# Patient Record
Sex: Female | Born: 1945 | Race: White | Hispanic: No | Marital: Single | Smoking: Former smoker
Health system: Southern US, Community
[De-identification: ages and names within clinical notes are randomized; demographics above are authoritative.]

## PROBLEM LIST (undated history)

## (undated) DIAGNOSIS — T8859XA Other complications of anesthesia, initial encounter: Secondary | ICD-10-CM

## (undated) DIAGNOSIS — F329 Major depressive disorder, single episode, unspecified: Secondary | ICD-10-CM

## (undated) DIAGNOSIS — R51 Headache: Secondary | ICD-10-CM

## (undated) DIAGNOSIS — N200 Calculus of kidney: Secondary | ICD-10-CM

## (undated) DIAGNOSIS — M1990 Unspecified osteoarthritis, unspecified site: Secondary | ICD-10-CM

## (undated) DIAGNOSIS — T7840XA Allergy, unspecified, initial encounter: Secondary | ICD-10-CM

## (undated) DIAGNOSIS — K589 Irritable bowel syndrome without diarrhea: Secondary | ICD-10-CM

## (undated) DIAGNOSIS — J189 Pneumonia, unspecified organism: Secondary | ICD-10-CM

## (undated) DIAGNOSIS — Z87442 Personal history of urinary calculi: Secondary | ICD-10-CM

## (undated) DIAGNOSIS — C801 Malignant (primary) neoplasm, unspecified: Secondary | ICD-10-CM

## (undated) DIAGNOSIS — J449 Chronic obstructive pulmonary disease, unspecified: Secondary | ICD-10-CM

## (undated) DIAGNOSIS — F32A Depression, unspecified: Secondary | ICD-10-CM

## (undated) DIAGNOSIS — N390 Urinary tract infection, site not specified: Secondary | ICD-10-CM

## (undated) HISTORY — PX: ABDOMINAL HYSTERECTOMY: SHX81

## (undated) HISTORY — DX: Allergy, unspecified, initial encounter: T78.40XA

## (undated) HISTORY — DX: Major depressive disorder, single episode, unspecified: F32.9

## (undated) HISTORY — DX: Irritable bowel syndrome, unspecified: K58.9

## (undated) HISTORY — DX: Malignant (primary) neoplasm, unspecified: C80.1

## (undated) HISTORY — DX: Calculus of kidney: N20.0

## (undated) HISTORY — DX: Headache: R51

## (undated) HISTORY — PX: COLON SURGERY: SHX602

## (undated) HISTORY — PX: BREAST SURGERY: SHX581

## (undated) HISTORY — DX: Depression, unspecified: F32.A

## (undated) HISTORY — DX: Urinary tract infection, site not specified: N39.0

## (undated) HISTORY — DX: Unspecified osteoarthritis, unspecified site: M19.90

---

## 1964-01-23 HISTORY — PX: OTHER SURGICAL HISTORY: SHX169

## 1970-01-22 HISTORY — PX: ABDOMINAL HYSTERECTOMY: SHX81

## 1984-01-23 HISTORY — PX: OVARIAN CYST SURGERY: SHX726

## 2008-01-23 DIAGNOSIS — Z8781 Personal history of (healed) traumatic fracture: Secondary | ICD-10-CM

## 2008-01-23 HISTORY — DX: Personal history of (healed) traumatic fracture: Z87.81

## 2009-08-24 ENCOUNTER — Encounter: Payer: Self-pay | Admitting: Family Medicine

## 2009-09-14 ENCOUNTER — Ambulatory Visit: Payer: Self-pay | Admitting: Family Medicine

## 2009-09-14 DIAGNOSIS — J45909 Unspecified asthma, uncomplicated: Secondary | ICD-10-CM | POA: Insufficient documentation

## 2009-09-14 DIAGNOSIS — Z85828 Personal history of other malignant neoplasm of skin: Secondary | ICD-10-CM | POA: Insufficient documentation

## 2009-09-14 DIAGNOSIS — F3289 Other specified depressive episodes: Secondary | ICD-10-CM | POA: Insufficient documentation

## 2009-09-14 DIAGNOSIS — R519 Headache, unspecified: Secondary | ICD-10-CM | POA: Insufficient documentation

## 2009-09-14 DIAGNOSIS — M1990 Unspecified osteoarthritis, unspecified site: Secondary | ICD-10-CM | POA: Insufficient documentation

## 2009-09-14 DIAGNOSIS — Z87442 Personal history of urinary calculi: Secondary | ICD-10-CM | POA: Insufficient documentation

## 2009-09-14 DIAGNOSIS — J309 Allergic rhinitis, unspecified: Secondary | ICD-10-CM | POA: Insufficient documentation

## 2009-09-14 DIAGNOSIS — M169 Osteoarthritis of hip, unspecified: Secondary | ICD-10-CM | POA: Insufficient documentation

## 2009-09-14 DIAGNOSIS — E8941 Symptomatic postprocedural ovarian failure: Secondary | ICD-10-CM | POA: Insufficient documentation

## 2009-09-14 DIAGNOSIS — R51 Headache: Secondary | ICD-10-CM | POA: Insufficient documentation

## 2009-09-14 DIAGNOSIS — F329 Major depressive disorder, single episode, unspecified: Secondary | ICD-10-CM

## 2009-09-20 ENCOUNTER — Encounter: Admission: RE | Admit: 2009-09-20 | Discharge: 2009-09-20 | Payer: Self-pay | Admitting: Family Medicine

## 2009-09-20 ENCOUNTER — Encounter: Payer: Self-pay | Admitting: Family Medicine

## 2009-09-27 ENCOUNTER — Encounter: Payer: Self-pay | Admitting: Family Medicine

## 2009-10-21 ENCOUNTER — Ambulatory Visit: Payer: Self-pay | Admitting: Family Medicine

## 2009-10-21 DIAGNOSIS — M79609 Pain in unspecified limb: Secondary | ICD-10-CM | POA: Insufficient documentation

## 2009-10-24 ENCOUNTER — Ambulatory Visit: Payer: Self-pay | Admitting: Family Medicine

## 2009-10-25 ENCOUNTER — Ambulatory Visit: Payer: Self-pay | Admitting: Diagnostic Radiology

## 2009-10-25 ENCOUNTER — Ambulatory Visit (HOSPITAL_BASED_OUTPATIENT_CLINIC_OR_DEPARTMENT_OTHER): Admission: RE | Admit: 2009-10-25 | Discharge: 2009-10-25 | Payer: Self-pay | Admitting: Family Medicine

## 2009-10-25 LAB — CONVERTED CEMR LAB
Hep B S Ab: NEGATIVE
Mumps IgG: 5.99 — ABNORMAL HIGH
Rubella: 264.9 intl units/mL — ABNORMAL HIGH
Rubeola IgG: 4.26 — ABNORMAL HIGH

## 2009-10-26 ENCOUNTER — Ambulatory Visit: Payer: Self-pay | Admitting: Family Medicine

## 2010-02-21 ENCOUNTER — Encounter: Payer: Self-pay | Admitting: Internal Medicine

## 2010-02-21 ENCOUNTER — Ambulatory Visit
Admission: RE | Admit: 2010-02-21 | Discharge: 2010-02-21 | Payer: Self-pay | Source: Home / Self Care | Attending: Internal Medicine | Admitting: Internal Medicine

## 2010-02-21 DIAGNOSIS — R319 Hematuria, unspecified: Secondary | ICD-10-CM | POA: Insufficient documentation

## 2010-02-21 DIAGNOSIS — N3090 Cystitis, unspecified without hematuria: Secondary | ICD-10-CM | POA: Insufficient documentation

## 2010-02-21 LAB — CONVERTED CEMR LAB
Glucose, Urine, Semiquant: NEGATIVE
Nitrite: NEGATIVE
Specific Gravity, Urine: 1.015
WBC Urine, dipstick: NEGATIVE
pH: 6

## 2010-02-21 NOTE — Assessment & Plan Note (Signed)
Summary: foot problem/shingle & flu shot//kn   Vital Signs:  Patient profile:   65 year old female Menstrual status:  hysterectomy Weight:      127.0 pounds Temp:     98.1 degrees F oral BP sitting:   110 / 72  (left arm) Cuff size:   regular  Vitals Entered By: Almeta Monas CMA Duncan Dull) (October 21, 2009 11:02 AM) CC: c/o knot on the left foot. Also wants flu and shingles vaccine   History of Present Illness: Pt here to have shingles vaccine and flu but is also c/o L foot pain and a knot laterally for 3-4 weeks.  No known injury but is painful.  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     Preventive Screening-Counseling & Management  Alcohol-Tobacco     Alcohol drinks/day: <1     Smoking Status: current     Smoking Cessation Counseling: yes     Smoke Cessation Stage: precontemplative     Packs/Day: 0.5     Year Started: 1960  Caffeine-Diet-Exercise     Caffeine use/day: 1     Does Patient Exercise: yes     Type of exercise: yoga, walking     Exercise (avg: min/session): <30     Times/week: 7     Exercise Counseling: to improve exercise regimen  Current Medications (verified): 1)  Aldara 5 % Crea (Imiquimod) 2)  Zostavax 16109 Unt/0.60ml Solr (Zoster Vaccine Live) .Marland Kitchen.. 1 Ml Im X1 3)  Calcium-Vitamin D 600-200 Mg-Unit Tabs (Calcium-Vitamin D) .Marland Kitchen.. 1 By Mouth Two Times A Day  Allergies (verified): No Known Drug Allergies  Past History:  Past Medical History: Last updated: 09/14/2009  UTI IBS Asthma Skin cancer, hx of Allergic rhinitis Depression Headache Nephrolithiasis, hx of Osteoarthritis  Past Surgical History: Last updated: 09/14/2009 hysterectomy 1971 fibroids-  [place]--ovaries taken 1990 secondary to cysts breast biopsy 1988---cyst aspirated  Family History: Last updated: 09/14/2009 Family History Hypertension Family History of Asthma Family History of Arthritis M-mac degen Family History of Skin cancer  Social History: Last  updated: 09/14/2009 1 dog, 2 cats and a bearded dragon Single Former Smoker Current Smoker Alcohol use-no Drug use-no Regular exercise-yes  Risk Factors: Alcohol Use: <1 (10/21/2009) Caffeine Use: 1 (10/21/2009) Exercise: yes (10/21/2009)  Risk Factors: Smoking Status: current (10/21/2009) Packs/Day: 0.5 (10/21/2009)  Family History: Reviewed history from 09/14/2009 and no changes required. Family History Hypertension Family History of Asthma Family History of Arthritis M-mac degen Family History of Skin cancer  Social History: Reviewed history from 09/14/2009 and no changes required. 1 dog, 2 cats and a bearded dragon Single Former Smoker Current Smoker Alcohol use-no Drug use-no Regular exercise-yes  Review of Systems      See HPI  Physical Exam  General:  Well-developed,well-nourished,in no acute distress; alert,appropriate and cooperative throughout examination Neck:  No deformities, masses, or tenderness noted. Lungs:  Normal respiratory effort, chest expands symmetrically. Lungs are clear to auscultation, no crackles or wheezes. Heart:  normal rate and no murmur.   Extremities:  L foot ----+ hard nodule lateral 5th metatarsal  Psych:  Cognition and judgment appear intact. Alert and cooperative with normal attention span and concentration. No apparent delusions, illusions, hallucinations  Impression & Recommendations:  Problem # 1:  FOOT PAIN, LEFT (ICD-729.5)  Orders: T-Foot Left Min 3 Views (73630TC)  Problem # 2:  PREVENTIVE HEALTH CARE (ICD-V70.0) form filled out rto mon for ppd and bring immunizations Orders: Venipuncture (04540)  Complete Medication List: 1)  Aldara 5 % Crea (Imiquimod) 2)  Zostavax 98119 Unt/0.66ml Solr (Zoster vaccine live) .Marland Kitchen.. 1 ml im x1 3)  Calcium-vitamin D 600-200 Mg-unit Tabs (Calcium-vitamin d) .Marland Kitchen.. 1 by mouth two  times a day  Other Orders: Flu Vaccine 2yrs + MEDICARE PATIENTS (J4782) Administration Flu vaccine - MCR (G0008) Zoster (Shingles) Vaccine Live (95621) Admin 1st Vaccine (30865)  Patient Instructions: 1)  return Monday for PPD Flu Vaccine Consent Questions     Do you have a history of severe allergic reactions to this vaccine? no    Any prior history of allergic reactions to egg and/or gelatin? no    Do you have a sensitivity to the preservative Thimersol? no    Do you have a past history of Guillan-Barre Syndrome? no    Do you currently have an acute febrile illness? no    Have you ever had a severe reaction to latex? no    Vaccine information given and explained to patient? yes    Are you currently pregnant? no    Lot Number:[id number]   Exp Date:07/22/2010   Site Given  Left Deltoid IM Monday for PPD   Immunizations Administered:  Zostavax # 1:    Vaccine Type: Zostavax    Site: right deltoid    Mfr: Merck    Dose: 0.5 ml    Route: [place]    Given by: Almeta Monas CMA (AAMA)    Exp. Date: 09/09/2010    Lot #: [id number]    VIS given: 11/03/04 given October 21, 2009. Marland Kitchenlbmedflu   Immunizations Administered:  Zostavax # 1:    Vaccine Type: Zostavax    Site: right deltoid    Mfr: Merck    Dose: 0.5 ml    Route: [place]    Given by: Almeta Monas CMA (AAMA)    Exp. Date: 09/09/2010    Lot #: [id number]    VIS given: 11/03/04 given October 21, 2009.

## 2010-02-21 NOTE — Miscellaneous (Signed)
Summary: Vaccine Records  Vaccine Records   Imported By: Lanelle Bal 10/31/2009 13:51:11  _____________________________________________________________________  External Attachment:    Type:   Image     Comment:   External Document

## 2010-02-21 NOTE — Assessment & Plan Note (Signed)
Summary: NEW TO ESTAB/CPX/CBS   Vital Signs:  Patient profile:   65 year old female Menstrual status:  hysterectomy Height:      61.75 inches Weight:      130 pounds BMI:     24.06 Temp:     98.5 degrees F oral Pulse rate:   76 / minute Pulse rhythm:   regular BP sitting:   102 / 68  (right arm)  Vitals Entered By: Almeta Monas CMA Duncan Dull) (September 14, 2009 10:22 AM) CC: New Est care, pt fasting pap is current     Menstrual Status hysterectomy   History of Present Illness: Pt here to establish.  She had been seen by gyn for vaginitis --see ov note and labs were done.  D/C now cleared up.    She had mentioned to him that her hair has been coming out.   She feels that she has lost 60%of her hair.     Preventive Screening-Counseling & Management  Alcohol-Tobacco     Alcohol drinks/day: <1     Smoking Status: current     Smoking Cessation Counseling: yes     Smoke Cessation Stage: precontemplative     Packs/Day: 0.5     Year Started: 1960  Caffeine-Diet-Exercise     Caffeine use/day: 1     Does Patient Exercise: yes     Type of exercise: yoga, walking     Exercise (avg: min/session): <30     Times/week: 7     Exercise Counseling: to improve exercise regimen  Hep-HIV-STD-Contraception     Dental Visit-last 6 months no     Dental Care Counseling: no ins     SBE monthly: no     SBE Education/Counseling: to perform regular SBE  Safety-Violence-Falls     Smoke Detectors: yes     Violence in the Home: no risk noted     Sexual Abuse: no     Fall Risk: no      Sexual History:  single.        Drug Use:  no.    Current Medications (verified): 1)  Aldara 5 % Crea (Imiquimod) 2)  Zostavax 16109 Unt/0.74ml Solr (Zoster Vaccine Live) .Marland Kitchen.. 1 Ml Im X1 3)  Calcium-Vitamin D 600-200 Mg-Unit Tabs (Calcium-Vitamin D) .Marland Kitchen.. 1 By Mouth Two Times A Day  Allergies (verified): No Known Drug Allergies  Past History:  Family History: Last updated: 09/14/2009 Family History  Hypertension Family History of Asthma Family History of Arthritis M-mac degen Family History of Skin cancer  Social History: Last updated: 09/14/2009 1 dog, 2 cats and a bearded dragon Single Former Smoker Current Smoker Alcohol use-no Drug use-no Regular exercise-yes  Risk Factors: Alcohol Use: <1 (09/14/2009) Caffeine Use: 1 (09/14/2009) Exercise: yes (09/14/2009)  Risk Factors: Smoking Status: current (09/14/2009) Packs/Day: 0.5 (09/14/2009)  Past Medical History:  UTI IBS Asthma Skin cancer, hx of Allergic rhinitis Depression Headache Nephrolithiasis, hx of Osteoarthritis  Past Surgical History: [place]rectomy 1971 fibroids-  [place]--ovaries taken 1990 secondary to cysts breast biopsy 1988---cyst aspirated  Family History: Reviewed history and no changes required. Family History Hypertension Family History of Asthma Family History of Arthritis M-mac degen Family History of Skin cancer  Social History: Reviewed history and no changes required. 1 dog, 2 cats and a bearded dragon Single Former Smoker Current Smoker Alcohol use-no Drug use-no Regular exercise-yes Smoking Status:  current Packs/Day:  0.5 Caffeine use/day:  1 Does Patient Exercise:  yes Dental Care w/in 6 mos.:  no Sexual History:  single Fall Risk:  no Drug Use:  no  Review of Systems      See HPI General:  Denies chills, fatigue, fever, loss of appetite, malaise, sleep disorder, sweats, weakness, and weight loss. Eyes:  Denies blurring, discharge, double vision, eye irritation, eye pain, halos, itching, light sensitivity, red eye, vision loss-1 eye, and vision loss-both eyes; c/o eyelid drooping-- due for optho. ENT:  Denies decreased hearing, difficulty swallowing, ear discharge, earache, hoarseness, nasal congestion, nosebleeds, postnasal drainage, ringing in ears, sinus pressure, and sore throat. CV:  Denies bluish discoloration of lips or nails, chest pain or discomfort,  difficulty breathing at night, difficulty breathing while lying down, fainting, fatigue, leg cramps with exertion, lightheadness, near fainting, palpitations, shortness of breath with exertion, swelling of feet, swelling of hands, and weight gain. Resp:  Denies chest discomfort, chest pain with inspiration, cough, coughing up blood, excessive snoring, hypersomnolence, morning headaches, pleuritic, shortness of breath, sputum productive, and wheezing. GI:  Denies abdominal pain, bloody stools, change in bowel habits, constipation, dark tarry stools, diarrhea, excessive appetite, gas, hemorrhoids, indigestion, loss of appetite, nausea, vomiting, vomiting blood, and yellowish skin color. GU:  Denies abnormal vaginal bleeding, decreased libido, discharge, dysuria, genital sores, hematuria, incontinence, nocturia, urinary frequency, and urinary hesitancy. MS:  Denies joint pain, joint redness, joint swelling, loss of strength, low back pain, mid back pain, muscle aches, muscle , cramps, muscle weakness, stiffness, and thoracic pain. Derm:  Denies changes in color of skin, changes in nail beds, dryness, excessive perspiration, flushing, hair loss, insect bite(s), itching, lesion(s), poor wound healing, and rash. Neuro:  Denies brief paralysis, difficulty with concentration, disturbances in coordination, falling down, headaches, inability to speak, memory loss, numbness, poor balance, seizures, sensation of room spinning, tingling, tremors, visual disturbances, and weakness. Psych:  Denies alternate hallucination ( auditory/visual), anxiety, depression, easily angered, easily tearful, irritability, mental problems, panic attacks, sense of great danger, suicidal thoughts/plans, thoughts of violence, unusual visions or sounds, and thoughts /plans of harming others. Endo:  Denies cold intolerance, excessive hunger, excessive thirst, excessive urination, heat intolerance, polyuria, and weight change. Heme:  Denies  abnormal bruising, bleeding, enlarge lymph nodes, fevers, pallor, and skin discoloration. Allergy:  Denies hives or rash, itching eyes, persistent infections, seasonal allergies, and sneezing.  Physical Exam  General:  Well-developed,well-nourished,in no acute distress; alert,appropriate and cooperative throughout examination Head:  Normocephalic and atraumatic without obvious abnormalities. No apparent alopecia or balding. Eyes:  vision grossly intact, pupils equal, pupils round, pupils reactive to light, and no injection.   Ears:  External ear exam shows no significant lesions or deformities.  Otoscopic examination reveals clear canals, tympanic membranes are intact bilaterally without bulging, retraction, inflammation or discharge. Hearing is grossly normal bilaterally. Nose:  External nasal examination shows no deformity or inflammation. Nasal mucosa are pink and moist without lesions or exudates. Mouth:  Oral mucosa and oropharynx without lesions or exudates.  Teeth in good repair. Neck:  No deformities, masses, or tenderness noted. Chest Wall:  No deformities, masses, or tenderness noted. Breasts:  No mass, nodules, thickening, tenderness, bulging, retraction, inflamation, nipple discharge or skin changes noted.   Lungs:  Normal respiratory effort, chest expands symmetrically. Lungs are clear to auscultation, no crackles or wheezes. Heart:  normal rate and no murmur.   Abdomen:  Bowel sounds positive,abdomen soft and non-tender without masses, organomegaly or hernias noted. Msk:  normal ROM, no joint tenderness, no joint swelling, no joint warmth, no redness over joints, no joint deformities, no joint instability,  and no crepitation.   Pulses:  R posterior tibial normal, R dorsalis pedis normal, R carotid normal, L posterior tibial normal, L dorsalis pedis normal, and L carotid normal.   Extremities:  No clubbing, cyanosis, edema, or deformity noted with normal full range of motion of all  joints.   Neurologic:  No cranial nerve deficits noted. Station and gait are normal. Plantar reflexes are down-going bilaterally. DTRs are symmetrical throughout. Sensory, motor and coordinative functions appear intact. Skin:  Intact without suspicious lesions or rashes Cervical Nodes:  No lymphadenopathy noted Axillary Nodes:  No palpable lymphadenopathy Psych:  Cognition and judgment appear intact. Alert and cooperative with normal attention span and concentration. No apparent delusions, illusions, hallucinations   Impression & Recommendations:  Problem # 1:  ARTIFICIAL MENOPAUSE (ICD-627.4)  Orders: Radiology Referral (Radiology)  Discussed treatment options.   Problem # 2:  OSTEOARTHRITIS (ICD-715.90)  Discussed use of medications, application of heat or cold, and exercises.   Problem # 3:  PREVENTIVE HEALTH CARE (ICD-V70.0) we will get labs from gyn ghm utd Orders: Radiology Referral (Radiology) EKG w/ Interpretation (93000)  Problem # 4:  DEPRESSION (ICD-311)  Problem # 5:  SKIN CANCER, HX OF (ICD-V10.83)  Orders: Dermatology Referral (Derma)  Problem # 6:  ASTHMA (ICD-493.90) Assessment: Improved  Complete Medication List: 1)  Aldara 5 % Crea (Imiquimod) 2)  Zostavax 04540 Unt/0.13ml Solr (Zoster vaccine live) .Marland Kitchen.. 1 ml im x1 3)  Calcium-vitamin D 600-200 Mg-unit Tabs (Calcium-vitamin d) .Marland Kitchen.. 1 by mouth two times a day  Patient Instructions: 1)  Take calcium +vitamin D daily.  Prescriptions: ZOSTAVAX 98119 UNT/0.65ML SOLR (ZOSTER VACCINE LIVE) 1 ml IM x1  #1 x 0   Entered and Authorized by:   Loreen Freud DO   Signed by:   Loreen Freud DO on 09/14/2009   Method used:   Print then Give to Patient   RxID:   [id number]    EKG  Procedure date:  09/14/2009  Findings:      Normal sinus rhythm with rate of:  69 bpm   Pneumovax Result Date:  10/08/2007 Pneumovax Result:  given Pneumovax Next Due:  5 yr Colonoscopy Result Date:   09/03/2007 Colonoscopy Result:  abnormal- IBS, c diff Colonoscopy Next Due:  10 yr PAP Next Due:  Not Indicated

## 2010-02-21 NOTE — Letter (Signed)
Summary: Health Exam Form/[facility] [employer]  Health Exam Form/[facility]   Imported By: Lanelle Bal 11/02/2009 11:49:49  _____________________________________________________________________  External Attachment:    Type:   Image     Comment:   External Document

## 2010-02-21 NOTE — Letter (Signed)
Summary: Arther Abbott MD OB GYN  Arther Abbott MD OB GYN   Imported By: Lanelle Bal 09/06/2009 09:51:38  _____________________________________________________________________  External Attachment:    Type:   Image     Comment:   External Document

## 2010-02-21 NOTE — Assessment & Plan Note (Signed)
Summary: ppd/cbs  Nurse Visit   Allergies: No Known Drug Allergies  Immunizations Administered:  PPD Skin Test:    Vaccine Type: PPD    Site: left forearm    Mfr: Sanofi Pasteur    Dose: 0.1 ml    Route: ID    Given by: Almeta Monas CMA (AAMA)    Exp. Date: 11/24/2010    Lot #: [id number]  Orders Added: 1)  Specimen Handling [99000] 2)  Venipuncture [16109] 3)  T- * Misc. Laboratory test [99999] 4)  TB Skin Test [86580] 5)  Admin 1st Vaccine [90471]  Appended Document: Orders Update    Clinical Lists Changes  Observations: Added new observation of TB PPDRESULT: negative (10/26/2009 13:32) Added new observation of PPD RESULT: < 5mm (10/26/2009 13:32) Added new observation of TB-PPD RDDTE: 10/26/2009 (10/26/2009 13:32)       PPD Results    Date of reading: 10/26/2009    Results: < 5mm    Interpretation: negative

## 2010-02-21 NOTE — Consult Note (Signed)
Summary: [facility]  [facility]   Imported By: Lanelle Bal 10/28/2009 11:46:43  _____________________________________________________________________  External Attachment:    Type:   Image     Comment:   External Document

## 2010-02-21 NOTE — Assessment & Plan Note (Signed)
Summary: SHOTS//PH  Nurse Visit   Allergies: No Known Drug Allergies  Immunizations Administered:  Hepatitis B Vaccine # 1:    Vaccine Type: HepB Adult    Site: left deltoid    Mfr: Merck    Dose: 0.1 ml    Route: IM    Given by: Almeta Monas CMA (AAMA)    Exp. Date: 04/21/2011    Lot #: [id number]    VIS given: 08/08/05 version given October 26, 2009.  Orders Added: 1)  Hepatitis B Vaccine >48yrs [90746] 2)  Admin 1st Vaccine [phone]

## 2010-02-22 ENCOUNTER — Encounter: Payer: Self-pay | Admitting: Internal Medicine

## 2010-02-24 LAB — CONVERTED CEMR LAB
Bacteria, UA: NONE SEEN
Casts: NONE SEEN /lpf

## 2010-02-28 ENCOUNTER — Ambulatory Visit (INDEPENDENT_AMBULATORY_CARE_PROVIDER_SITE_OTHER): Payer: Medicare PPO | Admitting: Family Medicine

## 2010-02-28 ENCOUNTER — Encounter: Payer: Self-pay | Admitting: Family Medicine

## 2010-02-28 DIAGNOSIS — N952 Postmenopausal atrophic vaginitis: Secondary | ICD-10-CM

## 2010-02-28 DIAGNOSIS — R319 Hematuria, unspecified: Secondary | ICD-10-CM

## 2010-02-28 LAB — CONVERTED CEMR LAB
Bilirubin Urine: NEGATIVE
Glucose, Urine, Semiquant: NEGATIVE
Protein, U semiquant: NEGATIVE
Specific Gravity, Urine: 1.01
WBC Urine, dipstick: NEGATIVE
pH: 5

## 2010-03-01 NOTE — Assessment & Plan Note (Signed)
Summary: possible UTI??//lch   Vital Signs:  Patient profile:   65 year old female Menstrual status:  hysterectomy Weight:      133.50 pounds Temp:     98.1 degrees F oral Pulse rate:   86 / minute Pulse rhythm:   regular BP sitting:   122 / 80  (left arm) Cuff size:   regular  Vitals Entered By: Army Fossa CMA (February 21, 2010 10:02 AM) CC: UTI?  Comments urinating blood x 3-4 days  unable to leave sample  [facility] [address]     History of Present Illness: 3 or 4 days history of dysuria Single episode of gross hematuria today No history of recurring UTIs however in the last 5 years she has developed a UTI usually yearly  Current Medications (verified): 1)  Calcium-Vitamin D 600-200 Mg-Unit Tabs (Calcium-Vitamin D) .Marland Kitchen.. 1 By Mouth Two Times A Day  Allergies (verified): No Known Drug Allergies  Past History:  Past Medical History: Reviewed history from 09/14/2009 and no changes required.  UTI IBS Asthma Skin cancer, hx of Allergic rhinitis Depression Headache Nephrolithiasis, hx of Osteoarthritis  Past Surgical History: Reviewed history from 09/14/2009 and no changes required. hysterectomy 1971 fibroids-  [place]--ovaries taken 1990 secondary to cysts breast biopsy 1988---cyst aspirated  Social History: Reviewed history from 09/14/2009 and no changes required. 1 dog, 2 cats and a bearded dragon Single Former Smoker Current Smoker Alcohol use-no Drug use-no Regular exercise-yes  Review of Systems General:  Denies fever; ?chills . GI:  Denies nausea and vomiting. GU:  (+) difficulty voiding  no back pain, no lower abd pain no vag rash or itching.  Physical Exam  General:  alert and well-developed.   Abdomen:  soft, non-tender, no distention, no masses, no guarding, and no rigidity.  no CVA tenderness Psych:  not anxious appearing and not depressed appearing.     Impression & Recommendations:  Problem # 1:  CYSTITIS (ICD-595.9) symptoms  consistent with cystitis, see prescriptions and instructions. She also admits to vaginal dryness, recommend to discuss with PCP possible Premarin cream to help prevent UTIs.  Her updated medication list for this problem includes:    Ciprofloxacin Hcl 500 Mg Tabs (Ciprofloxacin hcl) .Marland Kitchen... 1 by mouth two times a day x 3 days    Pyridium 100 Mg Tabs (Phenazopyridine hcl) .Marland Kitchen... 1 by mouth three times a day x 2 days  Complete Medication List: 1)  Calcium-vitamin D 600-200 Mg-unit Tabs (Calcium-vitamin d) .Marland Kitchen.. 1 by mouth two times a day 2)  Ciprofloxacin Hcl 500 Mg Tabs (Ciprofloxacin hcl) .Marland Kitchen.. 1 by mouth two times a day x 3 days 3)  Pyridium 100 Mg Tabs (Phenazopyridine hcl) .Marland Kitchen.. 1 by mouth three times a day x 2 days  Other Orders: UA Dipstick w/o Micro (automated)  (81003) T-Culture, Urine (62130-86578) T-Urine Microscopic ([id number])  Patient Instructions: 1)  lots of fluids 2)  call if no better in few days Prescriptions: PYRIDIUM 100 MG TABS (PHENAZOPYRIDINE HCL) 1 by mouth three times a day x 2 days  #6 x 0   Entered and Authorized by:   Nolon Rod. Mccabe Gloria MD   Signed by:   Nolon Rod. Husain Costabile MD on 02/21/2010   Method used:   Print then Give to Patient   RxID:   [id number] CIPROFLOXACIN HCL 500 MG TABS (CIPROFLOXACIN HCL) 1 by mouth two times a day x 3 days  #6 x 0   Entered and Authorized by:   Nolon Rod. Willona Phariss MD  Signed by:   Nolon Rod Abdur Hoglund MD on 02/21/2010   Method used:   Print then Give to Patient   RxID:   [id number]    Orders Added: 1)  UA Dipstick w/o Micro (automated)  [81003] 2)  T-Culture, Urine [[id number]] 3)  T-Urine Microscopic [[id number]] 4)  Est. Patient Level III [62952]    Laboratory Results   Urine Tests    Routine Urinalysis   Color: yellow Appearance: Clear Glucose: negative   (Normal Range: Negative) Bilirubin: negative   (Normal Range: Negative) Ketone: negative   (Normal Range: Negative) Spec. Gravity: 1.015   (Normal Range:  1.003-1.035) Blood: large   (Normal Range: Negative) pH: 6.0   (Normal Range: 5.0-8.0) Protein: negative   (Normal Range: Negative) Urobilinogen: 0.2   (Normal Range: 0-1) Nitrite: negative   (Normal Range: Negative) Leukocyte Esterace: negative   (Normal Range: Negative)    Comments: Army Fossa CMA  February 21, 2010 10:12 AM

## 2010-03-09 NOTE — Assessment & Plan Note (Signed)
Summary: RECURRENT UTI/RH.....  Nurse Visit   Vital Signs:  Patient profile:   64 year old female Menstrual status:  hysterectomy Weight:      132.2 pounds Temp:     98.2 degrees F oral BP sitting:   116 / 72  (left arm) Cuff size:   regular  Vitals Entered By: Almeta Monas CMA Duncan Dull) (February 28, 2010 11:13 AM)  Serial Vital Signs/Assessments:  Time      Position  BP       Pulse  Resp  Temp     By 11:13 AM            110/70                         Almeta Monas CMA (AAMA)  CC: wants to discuss recurrent UTI's   Current Medications (verified): 1)  Calcium-Vitamin D 600-200 Mg-Unit Tabs (Calcium-Vitamin D) .Marland Kitchen.. 1 By Mouth Two Times A Day 2)  Ciprofloxacin Hcl 500 Mg Tabs (Ciprofloxacin Hcl) .Marland Kitchen.. 1 By Mouth Two Times A Day X 3 Days 3)  Pyridium 100 Mg Tabs (Phenazopyridine Hcl) .Marland Kitchen.. 1 By Mouth Three Times A Day X 2 Days 4)  Premarin 0.625 Mg/gm Crea (Estrogens, Conjugated) .... 0.5 G Pv  3x A Week  Allergies (verified): No Known Drug Allergies Laboratory Results   Urine Tests    Routine Urinalysis   Color: yellow Appearance: Clear Glucose: negative   (Normal Range: Negative) Bilirubin: negative   (Normal Range: Negative) Ketone: negative   (Normal Range: Negative) Spec. Gravity: 1.010   (Normal Range: 1.003-1.035) Blood: moderate   (Normal Range: Negative) pH: 5.0   (Normal Range: 5.0-8.0) Protein: negative   (Normal Range: Negative) Urobilinogen: 0.2   (Normal Range: 0-1) Nitrite: negative   (Normal Range: Negative) Leukocyte Esterace: negative   (Normal Range: Negative)       Orders Added: 1)  Est. Patient Level III [54098]  Physical Exam  General:  Well-developed,well-nourished,in no acute distress; alert,appropriate and cooperative throughout examination Psych:  Oriented X3 and normally interactive.     Impression & Recommendations:  Problem # 1:  VAGINITIS, ATROPHIC (ICD-627.3)  Her updated medication list for this problem includes:  Premarin 0.625 Mg/gm Crea (Estrogens, conjugated) .Marland Kitchen... 0.5 g pv  3x a week  Discussed treatment options.   Problem # 2:  HEMATURIA UNSPECIFIED (ICD-599.70) recheck 2 weeks---will refer to urology as needed  The following medications were removed from the medication list:    Ciprofloxacin Hcl 500 Mg Tabs (Ciprofloxacin hcl) .Marland Kitchen... 1 by mouth two times a day x 3 days  Discussed medication use and hematuria work-up.   Complete Medication List: 1)  Calcium-vitamin D 600-200 Mg-unit Tabs (Calcium-vitamin d) .Marland Kitchen.. 1 by mouth two times a day 2)  Premarin 0.625 Mg/gm Crea (Estrogens, conjugated) .... 0.5 g pv  3x a week   History of Present Illness: Pt here to discuss frequent UTI--- pt understands culture was negative and she was told she may have symptoms secondary to vaginal dryness.  She wants to try premarin vaginal cream.      Prescriptions: PREMARIN 0.625 MG/GM CREA (ESTROGENS, CONJUGATED) 0.5 g PV  3x a week  #1 month x 3   Entered and Authorized by:   Loreen Freud DO   Signed by:   Loreen Freud DO on 02/28/2010   Method used:   Electronically to        [facility] [address] (retail)  [address]       [place], [place]  [address]       Ph: [phone]       Fax: [phone]   RxID:   [id number]

## 2010-03-14 ENCOUNTER — Encounter: Payer: Self-pay | Admitting: Family Medicine

## 2010-03-14 ENCOUNTER — Ambulatory Visit (INDEPENDENT_AMBULATORY_CARE_PROVIDER_SITE_OTHER): Payer: Medicare PPO | Admitting: Family Medicine

## 2010-03-14 DIAGNOSIS — R319 Hematuria, unspecified: Secondary | ICD-10-CM

## 2010-03-14 DIAGNOSIS — N952 Postmenopausal atrophic vaginitis: Secondary | ICD-10-CM

## 2010-03-14 LAB — CONVERTED CEMR LAB
Glucose, Urine, Semiquant: NEGATIVE
Ketones, urine, test strip: NEGATIVE
Nitrite: NEGATIVE
Specific Gravity, Urine: 1.01
pH: 5

## 2010-03-21 NOTE — Assessment & Plan Note (Signed)
Summary: rto 2 weeks/cbs   Vital Signs:  Patient profile:   65 year old female Menstrual status:  hysterectomy Height:      61 inches Weight:      134 pounds Temp:     98.0 degrees F oral Pulse rate:   74 / minute BP sitting:   118 / 76  (left arm)  Vitals Entered By: Jeremy Johann CMA (March 14, 2010 12:59 PM) CC: 2 week f/u med   History of Present Illness: Pt is definitely getting relief with cream.  We need to recheck urine for blood.    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 1 By Mouth Two Times A Day 2)  Premarin 0.625 Mg/gm Crea (Estrogens, Conjugated) .... 0.5 G Pv  3x A Week  Current Medications (verified): 1)  Calcium-Vitamin D 600-200 Mg-Unit Tabs (Calcium-Vitamin D) .Marland Kitchen.. 1 By Mouth Two Times A Day 2)  Premarin 0.625 Mg/gm Crea (Estrogens, Conjugated) .... 0.5 G Pv  3x A Week  Allergies (verified): No Known Drug Allergies  Past History:  Past Medical History: Last updated: 09/14/2009  UTI IBS Asthma Skin cancer, hx of Allergic rhinitis Depression Headache Nephrolithiasis, hx of Osteoarthritis  Past Surgical History: Last updated: 09/14/2009 hysterectomy 1971 fibroids-  [place]--ovaries taken 1990 secondary to cysts breast biopsy 1988---cyst aspirated  Family History: Last updated: 09/14/2009 Family History Hypertension Family History of Asthma Family History of  Arthritis M-mac degen Family History of Skin cancer  Social History: Last updated: 09/14/2009 1 dog, 2 cats and a bearded dragon Single Former Smoker Current Smoker Alcohol use-no Drug use-no Regular exercise-yes  Risk Factors: Alcohol Use: <1 (10/21/2009) Caffeine Use: 1 (10/21/2009) Exercise: yes (10/21/2009)  Risk Factors: Smoking Status: current (10/21/2009) Packs/Day: 0.5 (10/21/2009)  Family History: Reviewed history from 09/14/2009 and no changes required. Family History Hypertension Family History of Asthma Family History of Arthritis M-mac degen Family History of Skin cancer  Social History: Reviewed history from 09/14/2009 and no changes required. 1 dog, 2 cats and a bearded dragon Single Former Smoker Current Smoker Alcohol use-no Drug use-no Regular exercise-yes  Review of Systems      See HPI  Physical Exam  General:  Well-developed,well-nourished,in no acute distress; alert,appropriate and cooperative throughout examination Psych:  Oriented X3 and normally interactive.     Impression & Recommendations:  Problem # 1:  VAGINITIS, ATROPHIC (ICD-627.3)  Her updated medication list for this problem includes:    Premarin 0.625 Mg/gm Crea (Estrogens, conjugated) .Marland Kitchen... 0.5 g pv  3x a week  Discussed treatment options.   Orders: UA Dipstick w/o Micro (manual) (74259)  Problem # 2:  HEMATURIA UNSPECIFIED (ICD-599.70)  Discussed medication use and hematuria work-up.   Orders: UA Dipstick w/o Micro (manual) (56387)  Complete Medication List: 1)  Calcium-vitamin D 600-200 Mg-unit Tabs (Calcium-vitamin d) .Marland Kitchen.. 1 by mouth two times a day 2)  Premarin 0.625 Mg/gm Crea (Estrogens, conjugated) .... 0.5 g pv  3x a week  Patient Instructions: 1)  Please schedule a follow-up appointment in 1 month-- recheck urine only   Orders Added: 1)  Est. Patient Level III [[address]] 2)  UA Dipstick w/o Micro (manual) [81002]    Laboratory Results   Urine  Tests    Routine Urinalysis   Color: yellow Appearance: Hazy Glucose: negative   (Normal Range: Negative) Bilirubin: negative   (Normal Range: Negative) Ketone: negative   (Normal Range: Negative) Spec. Gravity: 1.010   (Normal Range: 1.003-1.035) Blood: trace-lysed   (Normal Range: Negative) pH: 5.0   (Normal Range: 5.0-8.0) Protein: negative   (Normal Range: Negative) Urobilinogen: 0.2   (Normal Range: 0-1) Nitrite: negative   (Normal Range: Negative) Leukocyte Esterace: trace   (Normal Range: Negative)

## 2010-10-19 ENCOUNTER — Other Ambulatory Visit: Payer: Self-pay | Admitting: Family Medicine

## 2010-10-19 DIAGNOSIS — Z1231 Encounter for screening mammogram for malignant neoplasm of breast: Secondary | ICD-10-CM

## 2010-11-06 ENCOUNTER — Ambulatory Visit
Admission: RE | Admit: 2010-11-06 | Discharge: 2010-11-06 | Disposition: A | Discharge status: Home / Self Care | Payer: Medicare PPO | Source: Ambulatory Visit | Attending: Family Medicine | Admitting: Family Medicine

## 2010-11-06 DIAGNOSIS — Z1231 Encounter for screening mammogram for malignant neoplasm of breast: Secondary | ICD-10-CM

## 2010-11-13 ENCOUNTER — Encounter: Payer: Self-pay | Admitting: Family Medicine

## 2010-11-15 ENCOUNTER — Encounter: Payer: Self-pay | Admitting: Family Medicine

## 2010-11-15 ENCOUNTER — Ambulatory Visit (INDEPENDENT_AMBULATORY_CARE_PROVIDER_SITE_OTHER): Payer: Medicare PPO | Admitting: Family Medicine

## 2010-11-15 VITALS — BP 112/64 | HR 84 | Temp 98.7°F | Ht 61.0 in | Wt 134.6 lb

## 2010-11-15 DIAGNOSIS — K589 Irritable bowel syndrome without diarrhea: Secondary | ICD-10-CM

## 2010-11-15 DIAGNOSIS — Z0000 Encounter for general adult medical examination without abnormal findings: Secondary | ICD-10-CM

## 2010-11-15 DIAGNOSIS — Z23 Encounter for immunization: Secondary | ICD-10-CM

## 2010-11-15 DIAGNOSIS — H538 Other visual disturbances: Secondary | ICD-10-CM

## 2010-11-15 DIAGNOSIS — Z79899 Other long term (current) drug therapy: Secondary | ICD-10-CM

## 2010-11-15 DIAGNOSIS — Z780 Asymptomatic menopausal state: Secondary | ICD-10-CM

## 2010-11-15 DIAGNOSIS — R319 Hematuria, unspecified: Secondary | ICD-10-CM

## 2010-11-15 DIAGNOSIS — L659 Nonscarring hair loss, unspecified: Secondary | ICD-10-CM

## 2010-11-15 LAB — POCT URINALYSIS DIPSTICK
Bilirubin, UA: NEGATIVE
Leukocytes, UA: NEGATIVE
Nitrite, UA: NEGATIVE
pH, UA: 7.5

## 2010-11-15 LAB — LIPID PANEL
Total CHOL/HDL Ratio: 5
Triglycerides: 326 mg/dL — ABNORMAL HIGH (ref 0.0–149.0)

## 2010-11-15 LAB — BASIC METABOLIC PANEL
BUN: 16 mg/dL (ref 6–23)
Calcium: 9.6 mg/dL (ref 8.4–10.5)
Creatinine, Ser: 0.6 mg/dL (ref 0.4–1.2)
GFR: 106.18 mL/min (ref >60.00)

## 2010-11-15 LAB — HEPATIC FUNCTION PANEL
ALT: 19 U/L (ref 0–35)
AST: 28 U/L (ref 0–37)
Alkaline Phosphatase: 63 U/L (ref 39–117)
Bilirubin, Direct: 0 mg/dL (ref 0.0–0.3)
Total Bilirubin: 0.6 mg/dL (ref 0.3–1.2)
Total Protein: 7.2 g/dL (ref 6.0–8.3)

## 2010-11-15 LAB — CBC WITH DIFFERENTIAL/PLATELET
Basophils Relative: 0.9 % (ref 0.0–3.0)
Eosinophils Relative: 3.5 % (ref 0.0–5.0)
Lymphocytes Relative: 32.7 % (ref 12.0–46.0)
Monocytes Absolute: 0.5 10*3/uL (ref 0.1–1.0)
Monocytes Relative: 6.7 % (ref 3.0–12.0)
Neutrophils Relative %: 56.2 % (ref 43.0–77.0)
Platelets: 299 10*3/uL (ref 150.0–400.0)
RBC: 4.45 Mil/uL (ref 3.87–5.11)
WBC: 7 10*3/uL (ref 4.5–10.5)

## 2010-11-15 LAB — TSH: TSH: 2.09 u[IU]/mL (ref 0.35–5.50)

## 2010-11-15 MED ORDER — DICYCLOMINE HCL 10 MG PO CAPS: ORAL_CAPSULE | ORAL | Status: DC

## 2010-11-15 NOTE — Patient Instructions (Signed)

## 2010-11-15 NOTE — Progress Notes (Signed)
Addended by: Kaisyn Reinhold K on: 11/15/2010 01:50 PM   Modules accepted: Orders  

## 2010-11-15 NOTE — Progress Notes (Signed)
Subjective:    Penny Lamb is a 65 y.o. female who presents for Medicare Annual/Subsequent preventive examination.  Preventive Screening-Counseling & Management  Tobacco History  Smoking status  . Smoker, Current Status Unknown -- 0.5 packs/day for 52 years  Smokeless tobacco  . Not on file     Problems Prior to Visit 1.   Current Problems (verified) Patient Active Problem List  Diagnoses  . DEPRESSION  . ALLERGIC RHINITIS  . ASTHMA  . ARTIFICIAL MENOPAUSE  . OSTEOARTHRITIS  . FOOT PAIN, LEFT  . HEADACHE  . SKIN CANCER, HX OF  . NEPHROLITHIASIS, HX OF  . CYSTITIS  . HEMATURIA UNSPECIFIED  . VAGINITIS, ATROPHIC    Medications Prior to Visit Current Outpatient Prescriptions on File Prior to Visit  Medication Sig Dispense Refill  . Calcium Carbonate-Vitamin D (CALCIUM-VITAMIN D) 600-200 MG-UNIT CAPS Take by mouth 2 (two) times daily.          Current Medications (verified) Current Outpatient Prescriptions  Medication Sig Dispense Refill  . Calcium Carbonate-Vitamin D (CALCIUM-VITAMIN D) 600-200 MG-UNIT CAPS Take by mouth 2 (two) times daily.        Marland Kitchen dicyclomine (BENTYL) 10 MG capsule 1-2 tabs po tid prn  90 capsule  0     Allergies (verified) Review of patient's allergies indicates no known allergies.   PAST HISTORY  Family History Family History  Problem Relation Age of Onset  . Cancer Other     SKIN  . Asthma Other   . Arthritis Other   . Hypertension Other     Social History History  Substance Use Topics  . Smoking status: Smoker, Current Status Unknown -- 0.5 packs/day for 52 years  . Smokeless tobacco: Not on file  . Alcohol Use: No     Are there smokers in your home (other than you)? Yes  Risk Factors Current exercise habits: The patient does not participate in regular exercise at present.  Dietary issues discussed: na   Cardiac risk factors: advanced age (older than 36 for men, 35 for women), sedentary lifestyle and smoking/  tobacco exposure.  Depression Screen (Note: if answer to either of the following is "Yes", a more complete depression screening is indicated)   Over the past two weeks, have you felt down, depressed or hopeless? No  Over the past two weeks, have you felt little interest or pleasure in doing things? No  Have you lost interest or pleasure in daily life? No  Do you often feel hopeless? No  Do you cry easily over simple problems? No  Activities of Daily Living In your present state of health, do you have any difficulty performing the following activities?:  Driving? No Managing money?  No Feeding yourself? No Getting from bed to chair? No Climbing a flight of stairs? No Preparing food and eating?: No Bathing or showering? No Getting dressed: No Getting to the toilet? No Using the toilet:No Moving around from place to place: No In the past year have you fallen or had a near fall?:No   Are you sexually active?  No  Do you have more than one partner?  No  Hearing Difficulties: No Do you often ask people to speak up or repeat themselves? No Do you experience ringing or noises in your ears? No Do you have difficulty understanding soft or whispered voices? No   Do you feel that you have a problem with memory? No  Do you often misplace items? No  Do you feel safe at home?  No  Cognitive Testing  Alert? Yes  Normal Appearance?Yes  Oriented to person? Yes  Place? Yes   Time? Yes  Recall of three objects?  Yes  Can perform simple calculations? Yes  Displays appropriate judgment?Yes  Can read the correct time from a watch face?Yes   Advanced Directives have been discussed with the patient? Yes  List the Names of Other Physician/Practitioners you currently use: 1.  [facility]   Indicate any recent Medical Services you may have received from other than [facility] providers in the past year (date may be approximate).  Immunization History  Administered Date(s) Administered  .  Hepatitis B 10/26/2009  . Influenza Split 11/15/2010  . Influenza Whole 10/21/2009  . Pneumococcal Polysaccharide 10/08/2007  . Zoster 10/21/2009    Screening Tests Health Maintenance  Topic Date Due  . Pneumococcal Polysaccharide Vaccine Age 79 And Over  06/21/2009  . Influenza Vaccine  10/23/2011  . Mammogram  11/14/2012  . Tetanus/tdap  11/15/2015  . Colonoscopy  11/14/2017  . Zostavax  Completed    All answers were reviewed with the patient and necessary referrals were made:  Loreen Freud, DO   11/15/2010   History reviewed: allergies, current medications, past family history, past medical history, past social history, past surgical history and problem list  Review of Systems  Review of Systems  Constitutional: Negative for activity change, appetite change and fatigue.  HENT: Negative for hearing loss, congestion, tinnitus and ear discharge.  dentist q72m Eyes: positive for visual disturbance (see optho -referred) Respiratory: Negative for cough, chest tightness and shortness of breath.   Cardiovascular: Negative for chest pain, palpitations and leg swelling.  Gastrointestinal: Negative for abdominal pain, diarrhea, constipation and abdominal distention.  Genitourinary: Negative for urgency, frequency, decreased urine volume and difficulty urinating.  Musculoskeletal: Negative for back pain, arthralgias and gait problem.  Skin: Negative for color change, pallor and rash.  Neurological: Negative for dizziness, light-headedness, numbness and headaches.  Hematological: Negative for adenopathy. Does not bruise/bleed easily.  Psychiatric/Behavioral: Negative for suicidal ideas, confusion, sleep disturbance, self-injury, dysphoric mood, decreased concentration and agitation.  Pt is able to read and write and can do all ADLs No risk for falling No abuse/ violence in home      Objective:     Vision by Snellen chart: opth  Body mass index is 25.43 kg/(m^2). BP 112/64   Pulse 84  Temp(Src) 98.7 F (37.1 C) (Oral)  Ht 5\' 1"  (1.549 m)  Wt 134 lb 9.6 oz (61.054 kg)  BMI 25.43 kg/m2  SpO2 97%  BP 112/64  Pulse 84  Temp(Src) 98.7 F (37.1 C) (Oral)  Ht 5\' 1"  (1.549 m)  Wt 134 lb 9.6 oz (61.054 kg)  BMI 25.43 kg/m2  SpO2 97% General appearance: alert, cooperative, appears stated age and no distress Head: Normocephalic, without obvious abnormality, atraumatic Eyes: conjunctivae/corneas clear. PERRL, EOM's intact. Fundi benign. Ears: normal TM's and external ear canals both ears Nose: Nares normal. Septum midline. Mucosa normal. No drainage or sinus tenderness. Throat: lips, mucosa, and tongue normal; teeth and gums normal Neck: no adenopathy, no carotid bruit, no JVD, supple, symmetrical, trachea midline and thyroid not enlarged, symmetric, no tenderness/mass/nodules Lungs: clear to auscultation bilaterally Breasts: normal appearance, no masses or tenderness Heart: regular rate and rhythm, S1, S2 normal, no murmur, click, rub or gallop Abdomen: soft, non-tender; bowel sounds normal; no masses,  no organomegaly Pelvic: not indicated; post-menopausal, no abnormal Pap smears in past Extremities: extremities normal, atraumatic, no cyanosis or edema Pulses:  2+ and symmetric Skin: Skin color, texture, turgor normal. No rashes or lesions Lymph nodes: Cervical, supraclavicular, and axillary nodes normal. Neurologic: Alert and oriented X 3, normal strength and tone. Normal symmetric reflexes. Normal coordination and gait      Assessment:     cpe     Plan:     During the course of the visit the patient was educated and counseled about appropriate screening and preventive services including:    Pneumococcal vaccine   Influenza vaccine  Td vaccine  Screening mammography  Screening Pap smear and pelvic exam   Bone densitometry screening  Colorectal cancer screening  Advanced directives: no Advanced directive.  Pt will talk to  lawyer.  Diet review for nutrition referral? Yes ____  Not Indicated _x___   Patient Instructions (the written plan) was given to the patient.  Medicare Attestation I have personally reviewed: The patient's medical and social history Their use of alcohol, tobacco or illicit drugs Their current medications and supplements The patient's functional ability including ADLs,fall risks, home safety risks, cognitive, and hearing and visual impairment Diet and physical activities Evidence for depression or mood disorders  The patient's weight, height, BMI, and visual acuity have been recorded in the chart.  I have made referrals, counseling, and provided education to the patient based on review of the above and I have provided the patient with a written personalized care plan for preventive services.     Loreen Freud, DO   11/15/2010

## 2010-11-18 LAB — URINE CULTURE

## 2010-11-22 ENCOUNTER — Telehealth: Payer: Self-pay

## 2010-11-22 MED ORDER — NITROFURANTOIN MONOHYD MACRO 100 MG PO CAPS: 100.0000 mg | ORAL_CAPSULE | Freq: Two times a day (BID) | ORAL | Status: AC

## 2010-11-22 MED ORDER — ATORVASTATIN CALCIUM 20 MG PO TABS: 20.0000 mg | ORAL_TABLET | Freq: Every day | ORAL | Status: DC

## 2010-11-22 NOTE — Telephone Encounter (Signed)
Discussed with patient and she voiced understanding. Rx faxed to the pharmacy and meal planning information sent to the patient     KP

## 2010-11-22 NOTE — Telephone Encounter (Addendum)
Message copied by Arnette Norris on Wed Nov 22, 2010 10:42 AM ------      Message from: Lelon Perla      Created: Thu Nov 16, 2010  3:35 PM       Cholesterol--- LDL goal < 100,  HDL >40,  TG < 150.  Diet and exercise will increase HDL and decrease LDL and TG.  Fish,  Fish Oil, Flaxseed oil will also help increase the HDL and decrease Triglycerides.   TG and LDL very high.  Diet and exercise is important but will not get pt down to goal.  Start Lipitor 20 mg #30  1 po qhs, 2 refills.    Recheck labs in 3 months.--- if TG don't come down we will need to add medication.         HDL diagnostics,  Hep, lipid , bmp   272.4              + UTI--- macrobid if symptomatic-- 1 po bid for 7 days

## 2010-11-22 NOTE — Telephone Encounter (Signed)
FYI----Rcv'd a call back from patient and she stated she changed her mind about taking the Lipitor, she sated she would rather wait, watch her diet and exercise and if elevated in 3 mos she would take the Lipitor. I advised patient of the risk of Heart attack and stroke and she voiced understanidng but declined meds for now.    KP

## 2010-11-27 ENCOUNTER — Telehealth: Payer: Self-pay | Admitting: *Deleted

## 2010-11-27 NOTE — Telephone Encounter (Signed)
Prior auth approved until 12-24-10

## 2011-01-23 HISTORY — PX: EYE SURGERY: SHX253

## 2011-02-15 ENCOUNTER — Encounter: Payer: Self-pay | Admitting: Family Medicine

## 2011-02-15 ENCOUNTER — Ambulatory Visit (INDEPENDENT_AMBULATORY_CARE_PROVIDER_SITE_OTHER): Payer: Medicare PPO | Admitting: Family Medicine

## 2011-02-15 VITALS — BP 136/72 | HR 85 | Temp 98.8°F | Wt 140.6 lb

## 2011-02-15 DIAGNOSIS — S0500XA Injury of conjunctiva and corneal abrasion without foreign body, unspecified eye, initial encounter: Secondary | ICD-10-CM

## 2011-02-15 DIAGNOSIS — J329 Chronic sinusitis, unspecified: Secondary | ICD-10-CM

## 2011-02-15 DIAGNOSIS — J45909 Unspecified asthma, uncomplicated: Secondary | ICD-10-CM

## 2011-02-15 DIAGNOSIS — S058X9A Other injuries of unspecified eye and orbit, initial encounter: Secondary | ICD-10-CM

## 2011-02-15 MED ORDER — SULFAMETHOXAZOLE-TRIMETHOPRIM 800-160 MG PO TABS: 1.0000 | ORAL_TABLET | Freq: Two times a day (BID) | ORAL | Status: AC

## 2011-02-15 MED ORDER — MOXIFLOXACIN HCL 0.5 % OP SOLN: 1.0000 [drp] | Freq: Three times a day (TID) | OPHTHALMIC | Status: AC

## 2011-02-15 MED ORDER — HYDROCODONE-HOMATROPINE 5-1.5 MG/5ML PO SYRP: 5.0000 mL | ORAL_SOLUTION | Freq: Three times a day (TID) | ORAL | Status: AC | PRN

## 2011-02-15 NOTE — Patient Instructions (Signed)
Corneal Abrasion The cornea is the clear covering at the front and center of the eye. When looking at the colored portion (iris) of the eye, you are looking through that person's cornea.  This very thin tissue is made up of many layers. The surface layer is a single layer of cells called the corneal epithelium. This is one of the most sensitive tissues in the body. If a scratch or injury causes the corneal epithelium to come off, it is called a corneal abrasion. If the injury extends to the tissues below the epithelium, the condition is called a corneal ulcer.  CAUSES   Scratches.   Trauma.   Foreign body in the eye.   Some people have recurrences of abrasions in the area of the original injury even after they heal. This is called recurrent erosion syndrome. Recurrent erosion syndromes generally improve and go away with time.  SYMPTOMS   Eye pain.   Difficulty or inability to keep the injured eye open.   The eye becomes very sensitive to light.   Recurrent erosions tend to happen suddenly, first thing in the morning - usually upon awakening and opening the eyes.  DIAGNOSIS  Your eye professional can diagnose a corneal abrasion during an eye exam. Dye is usually placed in the eye using a drop or a small paper strip moistened by the patient's tears. When the eye is examined with a special light, the abrasion shows up clearly because of the dye. TREATMENT   Small abrasions may be treated with antibiotic drops or ointment alone.   Usually a pressure patch is specially applied. Pressure patches prevent the eye from blinking, allowing the corneal epithelium to heal. Because blinking is less, a pressure patch also reduces the amount of pain present in the eye during healing. Most corneal abrasions heal within 2-3 days with no effect on vision. WARNING: Do not drive or operate machinery while your eye is patched. Your ability to judge distances is impaired.   If abrasion becomes infected and  spreads to the deeper tissues of the cornea, a corneal ulcer can result. This is serious because it can cause corneal scarring. Corneal scars interfere with light passing through the cornea, and cause a loss of vision in the involved eye.   If your caregiver has given you a follow-up appointment, it is very important to keep that appointment. Not keeping the appointment could result in a severe eye infection or permanent loss of vision. If there is any problem keeping the appointment, you must call back to this facility for assistance.  SEEK MEDICAL CARE IF:   You have pain, light sensitivity and a scratchy feeling in one eye (or both).   Your pressure patch keeps loosening up and you can blink your eye under the patch after treatment.   Any kind of discharge develops from the involved eye after treatment or if the lids stick together in the morning.   You have the same symptoms in the morning as you did with the original abrasion days, weeks or months after the abrasion healed.  MAKE SURE YOU:   Understand these instructions.   Will watch your condition.   Will get help right away if you are not doing well or get worse.  Document Released: 01/06/2000 Document Revised: 09/20/2010 Document Reviewed: 08/14/2007 [facility] Patient Information 2012 [place], [place].  Sinusitis Sinuses are air pockets within the bones of your face. The growth of bacteria within a sinus leads to infection. The infection prevents the  sinuses from draining. This infection is called sinusitis. SYMPTOMS  There will be different areas of pain depending on which sinuses have become infected.  The maxillary sinuses often produce pain beneath the eyes.   Frontal sinusitis may cause pain in the middle of the forehead and above the eyes.  Other problems (symptoms) include:  Toothaches.   Colored, pus-like (purulent) drainage from the nose.   Swelling, warmth, and tenderness over the sinus areas may be signs of  infection.  TREATMENT  Sinusitis is most often determined by an exam.X-rays may be taken. If x-rays have been taken, make sure you obtain your results or find out how you are to obtain them. Your caregiver may give you medications (antibiotics). These are medications that will help kill the bacteria causing the infection. You may also be given a medication (decongestant) that helps to reduce sinus swelling.  HOME CARE INSTRUCTIONS   Only take over-the-counter or prescription medicines for pain, discomfort, or fever as directed by your caregiver.   Drink extra fluids. Fluids help thin the mucus so your sinuses can drain more easily.   Applying either moist heat or ice packs to the sinus areas may help relieve discomfort.   Use saline nasal sprays to help moisten your sinuses. The sprays can be found at your local drugstore.  SEEK IMMEDIATE MEDICAL CARE IF:  You have a fever.   You have increasing pain, severe headaches, or toothache.   You have nausea, vomiting, or drowsiness.   You develop unusual swelling around the face or trouble seeing.  MAKE SURE YOU:   Understand these instructions.   Will watch your condition.   Will get help right away if you are not doing well or get worse.  Document Released: 01/08/2005 Document Revised: 09/20/2010 Document Reviewed: 08/07/2006 [facility] Patient Information 2012 [place], [place].

## 2011-02-15 NOTE — Progress Notes (Signed)
  Subjective:    Patient ID: Penny Lamb, female    DOB: 1945/02/22, 66 y.o.   MRN: [id number]  HPI    Review of Systems     Objective:   Physical Exam  Eyes:            Assessment & Plan:   Subjective:     Penny Lamb is a 66 y.o. female who presents for evaluation of sinus pain. Symptoms include: congestion, facial pain, fevers, itchy eyes, nasal congestion and sinus pressure. Onset of symptoms was 1 week ago. Symptoms have been gradually worsening since that time. Past history is significant for asthma. Patient is a non-smoker.  The following portions of the patient's history were reviewed and updated as appropriate: allergies, current medications, past family history, past medical history, past social history, past surgical history and problem list.  Review of Systems Pertinent items are noted in HPI.   Objective:    BP 136/72  Pulse 85  Temp(Src) 98.8 F (37.1 C) (Oral)  Wt 140 lb 9.6 oz (63.776 kg)  SpO2 97% General appearance: alert, cooperative, appears stated age and no distress Eyes: positive findings: sclera + red and + corneal abrasionR eye Ears: normal TM's and external ear canals both ears Nose: green discharge, moderate congestion, turbinates red, swollen, sinus tenderness bilateral Throat: lips, mucosa, and tongue normal; teeth and gums normal Neck: no adenopathy, supple, symmetrical, trachea midline and thyroid not enlarged, symmetric, no tenderness/mass/nodules Lungs: wheezes bilaterally Heart: S1, S2 normal Extremities: extremities normal, atraumatic, no cyanosis or edema    Assessment:    Acute bacterial sinusitis.   asthma exacerbation--proair, symbicort 80 Corneal abrasion---vigamox drops Plan:    Nasal steroids per medication orders. Antihistamines per medication orders. Bactrim per medication orders. Follow up in 5 days or as needed.

## 2011-02-19 ENCOUNTER — Telehealth: Payer: Self-pay | Admitting: *Deleted

## 2011-02-19 NOTE — Telephone Encounter (Signed)
Tried to call Pt no answer will try again later 

## 2011-02-19 NOTE — Telephone Encounter (Signed)
[facility] Triage Call Report Triage Record Num: [id number] Operator: Aundra Millet Patient Name: Penny Lamb Call Date & Time: 02/17/2011 8:22:02AM Patient Phone: [phone] PCP: Lelon Perla Patient Gender: Female PCP Fax : [phone] Patient DOB: 19-Nov-1945 Practice Name: [facility] Reason for Call: Caller: Adelie/Patient; PCP: Lelon Perla.; CB#: [phone]; Call regarding Side effects from SMZ - TMP; Pt seen in office 02/15/2011 for cough/ congestion and placed on SMZ- TMP. Later that day had nausea with 3 vomiting episodes, confusion, hallucinations , HA and stiff neck. Hasnt taken anymore of the medications 02/15/2011. and has had no more of these sx's. Coughing continues and coughs up yellow productive sputum with chest pain. RN reached see ED DISP for cough with chest pain per Cough protocol , and RN adv to be seen at [facility] ED now - Pt declined stating if she gets worse will be seen at ED or [facility] Protocol(s) Used: Cough - Adult Recommended Outcome per Protocol: See ED Immediately Reason for Outcome: Sudden onset of shortness of breath, difficulty breathing, chest pain OR cough with blood tinged sputum. Care Advice: ~ Another adult should drive. ~ Do not give the patient anything to eat or drink. Call EMS 911 if signs and symptoms of shock develop (such as unable to stand due to faintness, dizziness, or lightheadedness; new onset of confusion; slow to respond or difficult to awaken; skin is pale, gray, cool, or moist to touch; severe weakness; loss of consciousness). ~ ~ IMMEDIATE ACTION Write down provider's name. List or place the following in a bag for transport with the patient: current prescription and/or nonprescription medications; alternative treatments, therapies and medications; and street drugs.

## 2011-02-23 NOTE — Telephone Encounter (Signed)
Tried to call Pt unable to get phone to work phone ring once then busy sound comes on phone. Will try later.

## 2011-03-19 NOTE — Telephone Encounter (Signed)
noted 

## 2011-03-19 NOTE — Telephone Encounter (Signed)
Pt states that she just wanted to inform us of the bad side effect that she had to med. Advise Pt that I have updated allergy list to reflect these reaction to med. Pt notes that she would not like to be Rx these med again due to NVD and severe headache from antibiotic and cough med.

## 2011-03-19 NOTE — Telephone Encounter (Signed)
Left message to call office

## 2011-03-26 ENCOUNTER — Telehealth: Payer: Self-pay

## 2011-03-26 NOTE — Telephone Encounter (Signed)
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 Pt was into the office 6 weeks ago and was treated with Vigamox eye gtts - she finished the full 5 days and was well for 1 week then the sx resumed. So she retreated it with the same. Pt states this has happened a total of 3 more times/exact same sx. Office note sent with pt request - would MD call her in something else or does she need to go and see a specialist?. Pt uses [facility] on [address]. Office note sent in [place]. 'OFFICE PLEASE CALL PT IF THERE IS ANOTHER ABX EYE Protocol(s) Used: Eye: Other Problems Recommended Outcome per Protocol: Provide Home/Self Care Reason for Outcome: Feeling of dry eyes with stinging, burning, or strained vision Care Advice: ~ 03/04/  **MESSAGE CAME OVER AGAIN, THIS TIME I WAS ABLE TO COPY MESSAGE**

## 2011-03-26 NOTE — Telephone Encounter (Signed)
Message would not copy from email  Patient with recurrent conjunctivitis and would like a different ABX called in, please call at [phone]  I called and spoke with patient, patient noted she really feels the need to see a specialist because she continues to have this eye problem. Patient was asked if she has an Librarian, academic. Patient responded: Yes, patient will contact them and go from there.

## 2011-06-15 ENCOUNTER — Encounter: Payer: Self-pay | Admitting: Family Medicine

## 2011-06-15 ENCOUNTER — Ambulatory Visit (INDEPENDENT_AMBULATORY_CARE_PROVIDER_SITE_OTHER): Payer: Medicare PPO | Admitting: Family Medicine

## 2011-06-15 VITALS — BP 102/62 | HR 84 | Temp 97.8°F | Wt 138.0 lb

## 2011-06-15 DIAGNOSIS — N898 Other specified noninflammatory disorders of vagina: Secondary | ICD-10-CM

## 2011-06-15 DIAGNOSIS — R319 Hematuria, unspecified: Secondary | ICD-10-CM

## 2011-06-15 DIAGNOSIS — N390 Urinary tract infection, site not specified: Secondary | ICD-10-CM

## 2011-06-15 LAB — POCT URINALYSIS DIPSTICK
Bilirubin, UA: NEGATIVE
Ketones, UA: NEGATIVE
Spec Grav, UA: 1.02

## 2011-06-15 MED ORDER — METRONIDAZOLE 0.75 % VA GEL: VAGINAL | Status: DC

## 2011-06-15 NOTE — Patient Instructions (Signed)
Bacterial Vaginosis Bacterial vaginosis (BV) is a vaginal infection where the normal balance of bacteria in the vagina is disrupted. The normal balance is then replaced by an overgrowth of certain bacteria. There are several different kinds of bacteria that can cause BV. BV is the most common vaginal infection in women of childbearing age. CAUSES   The cause of BV is not fully understood. BV develops when there is an increase or imbalance of harmful bacteria.   Some activities or behaviors can upset the normal balance of bacteria in the vagina and put women at increased risk including:   Having a new sex partner or multiple sex partners.   Douching.   Using an intrauterine device (IUD) for contraception.   It is not clear what role sexual activity plays in the development of BV. However, women that have never had sexual intercourse are rarely infected with BV.  Women do not get BV from toilet seats, bedding, swimming pools or from touching objects around them.  SYMPTOMS   Grey vaginal discharge.   A fish-like odor with discharge, especially after sexual intercourse.   Itching or burning of the vagina and vulva.   Burning or pain with urination.   Some women have no signs or symptoms at all.  DIAGNOSIS  Your caregiver must examine the vagina for signs of BV. Your caregiver will perform lab tests and look at the sample of vaginal fluid through a microscope. They will look for bacteria and abnormal cells (clue cells), a pH test higher than 4.5, and a positive amine test all associated with BV.  RISKS AND COMPLICATIONS   Pelvic inflammatory disease (PID).   Infections following gynecology surgery.   Developing HIV.   Developing herpes virus.  TREATMENT  Sometimes BV will clear up without treatment. However, all women with symptoms of BV should be treated to avoid complications, especially if gynecology surgery is planned. Female partners generally do not need to be treated. However,  BV may spread between female sex partners so treatment is helpful in preventing a recurrence of BV.   BV may be treated with antibiotics. The antibiotics come in either pill or vaginal cream forms. Either can be used with nonpregnant or pregnant women, but the recommended dosages differ. These antibiotics are not harmful to the baby.   BV can recur after treatment. If this happens, a second round of antibiotics will often be prescribed.   Treatment is important for pregnant women. If not treated, BV can cause a premature delivery, especially for a pregnant woman who had a premature birth in the past. All pregnant women who have symptoms of BV should be checked and treated.   For chronic reoccurrence of BV, treatment with a type of prescribed gel vaginally twice a week is helpful.  HOME CARE INSTRUCTIONS   Finish all medication as directed by your caregiver.   Do not have sex until treatment is completed.   Tell your sexual partner that you have a vaginal infection. They should see their caregiver and be treated if they have problems, such as a mild rash or itching.   Practice safe sex. Use condoms. Only have 1 sex partner.  PREVENTION  Basic prevention steps can help reduce the risk of upsetting the natural balance of bacteria in the vagina and developing BV:  Do not have sexual intercourse (be abstinent).   Do not douche.   Use all of the medicine prescribed for treatment of BV, even if the signs and symptoms go away.     Tell your sex partner if you have BV. That way, they can be treated, if needed, to prevent reoccurrence.  SEEK MEDICAL CARE IF:   Your symptoms are not improving after 3 days of treatment.   You have increased discharge, pain, or fever.  MAKE SURE YOU:   Understand these instructions.   Will watch your condition.   Will get help right away if you are not doing well or get worse.  FOR MORE INFORMATION  Division of STD Prevention (DSTDP), Centers for Disease  Control and Prevention: www.cdc.gov/std American Social Health Association (ASHA): www.ashastd.org  Document Released: 01/08/2005 Document Revised: 12/28/2010 Document Reviewed: 07/01/2008 ExitCare Patient Information 2012 [employer]. 

## 2011-06-15 NOTE — Progress Notes (Signed)
Addended by: Candie Echevaria L on: 06/15/2011 10:36 AM   Modules accepted: Orders

## 2011-06-15 NOTE — Progress Notes (Signed)
  Subjective:    Patient ID: Penny Lamb, female    DOB: 15-Oct-1945, 66 y.o.   MRN: [id number]  HPI Pt here c/o vaginal d/c x 3 weeks.  She has hx of bv and yeast and is not sure which it is.  D/c is thin.  No odor. + dysuria   Review of Systems As above    Objective:   Physical Exam  Constitutional: She is oriented to person, place, and time. She appears well-developed and well-nourished.  Abdominal: Soft. She exhibits no distension.  Genitourinary: Uterus normal. Vaginal discharge found.        Thin d/c --scant amount No lesions Wet prep ordered  Neurological: She is alert and oriented to person, place, and time.  Psychiatric: She has a normal mood and affect. Her behavior is normal.          Assessment & Plan:  Vaginitis--  ?  bv                metrogel vag 1 app qhs for 5 days                 Check urine                Call prn

## 2011-06-16 LAB — WET PREP BY MOLECULAR PROBE
Candida species: NEGATIVE
Gardnerella vaginalis: NEGATIVE

## 2011-06-17 LAB — URINE CULTURE: Colony Count: NO GROWTH

## 2011-10-16 ENCOUNTER — Other Ambulatory Visit: Payer: Self-pay | Admitting: Family Medicine

## 2011-10-16 DIAGNOSIS — Z1231 Encounter for screening mammogram for malignant neoplasm of breast: Secondary | ICD-10-CM

## 2011-10-25 ENCOUNTER — Ambulatory Visit (INDEPENDENT_AMBULATORY_CARE_PROVIDER_SITE_OTHER): Payer: Medicare PPO

## 2011-10-25 ENCOUNTER — Ambulatory Visit: Payer: Medicare PPO

## 2011-10-25 DIAGNOSIS — Z23 Encounter for immunization: Secondary | ICD-10-CM

## 2011-11-09 ENCOUNTER — Ambulatory Visit
Admission: RE | Admit: 2011-11-09 | Discharge: 2011-11-09 | Disposition: A | Discharge status: Home / Self Care | Payer: Medicare PPO | Source: Ambulatory Visit | Attending: Family Medicine | Admitting: Family Medicine

## 2011-11-09 DIAGNOSIS — Z1231 Encounter for screening mammogram for malignant neoplasm of breast: Secondary | ICD-10-CM

## 2011-11-14 ENCOUNTER — Other Ambulatory Visit: Payer: Self-pay | Admitting: Family Medicine

## 2011-11-14 DIAGNOSIS — R928 Other abnormal and inconclusive findings on diagnostic imaging of breast: Secondary | ICD-10-CM

## 2011-11-20 ENCOUNTER — Ambulatory Visit
Admission: RE | Admit: 2011-11-20 | Discharge: 2011-11-20 | Disposition: A | Discharge status: Home / Self Care | Payer: Medicare PPO | Source: Ambulatory Visit | Attending: Family Medicine | Admitting: Family Medicine

## 2011-11-20 ENCOUNTER — Other Ambulatory Visit: Payer: Self-pay | Admitting: Family Medicine

## 2011-11-20 DIAGNOSIS — R928 Other abnormal and inconclusive findings on diagnostic imaging of breast: Secondary | ICD-10-CM

## 2012-01-09 ENCOUNTER — Ambulatory Visit (INDEPENDENT_AMBULATORY_CARE_PROVIDER_SITE_OTHER): Payer: Medicare PPO | Admitting: Family Medicine

## 2012-01-09 ENCOUNTER — Emergency Department (HOSPITAL_BASED_OUTPATIENT_CLINIC_OR_DEPARTMENT_OTHER)
Admission: EM | Admit: 2012-01-09 | Discharge: 2012-01-09 | Disposition: A | Discharge status: Home / Self Care | Payer: Medicare PPO | Attending: Emergency Medicine | Admitting: Emergency Medicine

## 2012-01-09 ENCOUNTER — Emergency Department (HOSPITAL_BASED_OUTPATIENT_CLINIC_OR_DEPARTMENT_OTHER): Payer: Medicare PPO

## 2012-01-09 ENCOUNTER — Encounter: Payer: Self-pay | Admitting: Family Medicine

## 2012-01-09 ENCOUNTER — Encounter (HOSPITAL_BASED_OUTPATIENT_CLINIC_OR_DEPARTMENT_OTHER): Payer: Self-pay | Admitting: *Deleted

## 2012-01-09 VITALS — BP 134/90 | HR 84 | Temp 97.8°F | Wt 147.8 lb

## 2012-01-09 DIAGNOSIS — J45909 Unspecified asthma, uncomplicated: Secondary | ICD-10-CM | POA: Insufficient documentation

## 2012-01-09 DIAGNOSIS — Z8719 Personal history of other diseases of the digestive system: Secondary | ICD-10-CM | POA: Insufficient documentation

## 2012-01-09 DIAGNOSIS — Z85828 Personal history of other malignant neoplasm of skin: Secondary | ICD-10-CM | POA: Insufficient documentation

## 2012-01-09 DIAGNOSIS — Z8739 Personal history of other diseases of the musculoskeletal system and connective tissue: Secondary | ICD-10-CM | POA: Insufficient documentation

## 2012-01-09 DIAGNOSIS — Z885 Allergy status to narcotic agent status: Secondary | ICD-10-CM | POA: Insufficient documentation

## 2012-01-09 DIAGNOSIS — K589 Irritable bowel syndrome without diarrhea: Secondary | ICD-10-CM | POA: Insufficient documentation

## 2012-01-09 DIAGNOSIS — R0781 Pleurodynia: Secondary | ICD-10-CM

## 2012-01-09 DIAGNOSIS — X500XXA Overexertion from strenuous movement or load, initial encounter: Secondary | ICD-10-CM | POA: Insufficient documentation

## 2012-01-09 DIAGNOSIS — Z8659 Personal history of other mental and behavioral disorders: Secondary | ICD-10-CM | POA: Insufficient documentation

## 2012-01-09 DIAGNOSIS — F17200 Nicotine dependence, unspecified, uncomplicated: Secondary | ICD-10-CM | POA: Insufficient documentation

## 2012-01-09 DIAGNOSIS — S2249XA Multiple fractures of ribs, unspecified side, initial encounter for closed fracture: Secondary | ICD-10-CM

## 2012-01-09 DIAGNOSIS — R079 Chest pain, unspecified: Secondary | ICD-10-CM

## 2012-01-09 DIAGNOSIS — Z8679 Personal history of other diseases of the circulatory system: Secondary | ICD-10-CM | POA: Insufficient documentation

## 2012-01-09 DIAGNOSIS — Z881 Allergy status to other antibiotic agents status: Secondary | ICD-10-CM | POA: Insufficient documentation

## 2012-01-09 DIAGNOSIS — Y9389 Activity, other specified: Secondary | ICD-10-CM | POA: Insufficient documentation

## 2012-01-09 DIAGNOSIS — Z87440 Personal history of urinary (tract) infections: Secondary | ICD-10-CM | POA: Insufficient documentation

## 2012-01-09 DIAGNOSIS — Z87442 Personal history of urinary calculi: Secondary | ICD-10-CM | POA: Insufficient documentation

## 2012-01-09 DIAGNOSIS — Y9229 Other specified public building as the place of occurrence of the external cause: Secondary | ICD-10-CM | POA: Insufficient documentation

## 2012-01-09 DIAGNOSIS — Z9109 Other allergy status, other than to drugs and biological substances: Secondary | ICD-10-CM | POA: Insufficient documentation

## 2012-01-09 DIAGNOSIS — S2239XA Fracture of one rib, unspecified side, initial encounter for closed fracture: Secondary | ICD-10-CM | POA: Insufficient documentation

## 2012-01-09 DIAGNOSIS — Z90710 Acquired absence of both cervix and uterus: Secondary | ICD-10-CM | POA: Insufficient documentation

## 2012-01-09 DIAGNOSIS — Z79899 Other long term (current) drug therapy: Secondary | ICD-10-CM | POA: Insufficient documentation

## 2012-01-09 MED ORDER — HYDROCODONE-ACETAMINOPHEN 5-300 MG PO TABS: ORAL_TABLET | ORAL | Status: DC

## 2012-01-09 MED ORDER — HYDROCODONE-ACETAMINOPHEN 5-325 MG PO TABS: 2.0000 | ORAL_TABLET | ORAL | Status: AC | PRN

## 2012-01-09 MED ORDER — MORPHINE SULFATE 4 MG/ML IJ SOLN
4.0000 mg | Freq: Once | INTRAMUSCULAR | Status: AC
  Administered 2012-01-09: 4 mg via INTRAMUSCULAR
  Filled 2012-01-09: qty 1

## 2012-01-09 MED ORDER — ONDANSETRON HCL 4 MG/2ML IJ SOLN
4.0000 mg | Freq: Once | INTRAMUSCULAR | Status: AC
  Administered 2012-01-09: 4 mg via INTRAMUSCULAR
  Filled 2012-01-09: qty 2

## 2012-01-09 NOTE — Progress Notes (Signed)
  Subjective:    Patient ID: Penny Lamb, female    DOB: November 09, 1945, 66 y.o.   MRN: [id number]  HPI Pt here c/o r side rib pain.  Pt went to chiropractor yesterday and felt ribs cracking during adjustment.  She is here in excrutiating pain, crying.     Review of Systems As above    Objective:   Physical Exam BP 134/90  Pulse 84  Temp 97.8 F (36.6 C) (Oral)  Wt 147 lb 12.8 oz (67.042 kg)  SpO2 96% General appearance: alert, cooperative, appears stated age and no distress Lungs: clear to auscultation bilaterally Heart: S1, S2 normal MS--  + some bruising,  Pain with even light touch under R breast       Assessment & Plan:

## 2012-01-09 NOTE — Assessment & Plan Note (Addendum)
Order put in for xray at [facility] rx for pain meds Suspect rib fx

## 2012-01-09 NOTE — ED Notes (Signed)
Patient states she was at her Chiropractor office yesterday receiving an adjustment of her back, during the adjustment, she felt and heard a pop in her right breast.  Now has pain under right breast radiating into her right back.

## 2012-01-09 NOTE — ED Provider Notes (Signed)
Medical screening examination/treatment/procedure(s) were performed by non-physician practitioner and as supervising physician I was immediately available for consultation/collaboration.   Douglas Rooks, MD 01/09/12 1310 

## 2012-01-09 NOTE — Patient Instructions (Signed)
Rib Fracture  The ribs are like a cage that goes around your upper chest. The ribs protect your lungs. Your ribs move when you breathe, so it hurts if a rib is broken.  HOME CARE  · Avoid activities that cause pain to the injured area. Protect your injured area.  · Eat a normal, healthy diet.  · Drink enough fluids to keep your pee (urine) clear or pale yellow.  · Take deep breaths many times a day. Cough many times a day while hugging a pillow.  · Do not wear a rib belt or binder on the chest unless told by your doctor. Rib belts or binders do not allow you to breathe deeply.  · Only take medicine as told by your doctor.  GET HELP RIGHT AWAY IF:   · You have a fever.  · You cannot stop coughing or cough up thick or bloody spit (mucus).  · You have trouble breathing.  · You feel sick to your stomach (nauseous), throw up (vomit), or have belly (abdominal) pain.  · Your pain gets worse and medicine does not help.  MAKE SURE YOU:   · Understand these instructions.  · Will watch this condition.  · Will get help right away if you are not doing well or get worse.  Document Released: 10/18/2007 Document Revised: 04/02/2011 Document Reviewed: 10/18/2007  ExitCare® Patient Information ©2013 [employer].

## 2012-01-09 NOTE — ED Provider Notes (Signed)
History     CSN: [id number]  Arrival date & time 01/09/12  1117   First MD Initiated Contact with Patient 01/09/12 1217      Chief Complaint  Patient presents with  . Rib Injury    (Consider location/radiation/quality/duration/timing/severity/associated sxs/prior treatment) Patient is a 66 y.o. female presenting with chest pain. The history is provided by the patient. No language interpreter was used.  Chest Pain The chest pain began yesterday. Chest pain occurs constantly. The chest pain is unchanged. The pain is associated with breathing and exertion. At its most intense, the pain is at 10/10. The pain is currently at 10/10. The severity of the pain is severe. The quality of the pain is described as aching and sharp. Chest pain is worsened by certain positions. She tried nothing for the symptoms.   Pt saw her chiropractor yesterday.  Pt reports he was popping her back and she had a loud pop in her right ribs and now has pain.  Pt reports she had broken ribs in 2009 in the same area  Past Medical History  Diagnosis Date  . UTI (urinary tract infection)   . IBS (irritable bowel syndrome)   . Asthma   . Arthritis   . Cancer     SKIN  . Depression   . Allergy     RHINITIS  . Headache   . Nephrolithiasis     Past Surgical History  Procedure Date  . Abdominal hysterectomy   . Breast surgery     BIOPSY.....CYST ASPIRATED    Family History  Problem Relation Age of Onset  . Cancer Other     SKIN  . Asthma Other   . Arthritis Other   . Hypertension Other     History  Substance Use Topics  . Smoking status: Smoker, Current Status Unknown -- 0.5 packs/day for 52 years  . Smokeless tobacco: Not on file  . Alcohol Use: No    OB History    Grav Para Term Preterm Abortions TAB SAB Ect Mult Living                  Review of Systems  Cardiovascular: Positive for chest pain.  All other systems reviewed and are negative.    Allergies  Bactrim and Codeine  Home  Medications   Current Outpatient Rx  Name  Route  Sig  Dispense  Refill  . ATORVASTATIN CALCIUM 20 MG PO TABS   Oral   Take 1 tablet (20 mg total) by mouth daily.   30 tablet   2   . CALCIUM-VITAMIN D 600-200 MG-UNIT PO CAPS   Oral   Take by mouth 2 (two) times daily.           Marland Kitchen DICYCLOMINE HCL 10 MG PO CAPS      1-2 tabs po tid prn   90 capsule   0   . HYDROCODONE-ACETAMINOPHEN 5-300 MG PO TABS      1-2 po q6h prn pain   30 each   0   . METRONIDAZOLE 0.75 % VA GEL      1 applicator pv qhs x5 days   70 g   0     BP 134/66  Pulse 81  Temp 97.7 F (36.5 C) (Oral)  Resp 22  SpO2 100%  Physical Exam  Nursing note and vitals reviewed. Constitutional: She appears well-developed and well-nourished.  HENT:  Head: Normocephalic.  Right Ear: External ear normal.  Mouth/Throat: Oropharynx is clear and  moist.  Eyes: Pupils are equal, round, and reactive to light.  Neck: Normal range of motion. Neck supple.  Cardiovascular: Normal rate and normal heart sounds.   Pulmonary/Chest: Effort normal. She exhibits tenderness.       Tender right anterior ribs,  Abdominal: Soft. Bowel sounds are normal.  Musculoskeletal: Normal range of motion.  Neurological: She is alert.  Skin: Skin is warm.  Psychiatric: She has a normal mood and affect.    ED Course  Procedures (including critical care time)  Labs Reviewed - No data to display Dg Chest 2 View  01/09/2012  *RADIOLOGY REPORT*  Clinical Data: Right rib pain  CHEST - 2 VIEW  Comparison: None.  Findings: The lungs are clear without focal infiltrate, edema, pneumothorax or pleural effusion.  Mild hyperexpansion noted with underlying chronic interstitial coarsening. The cardiopericardial silhouette is within normal limits for size. Imaged bony structures of the thorax are intact.  IMPRESSION: No acute cardiopulmonary findings.   Original Report Authenticated By: Kennith Center, M.D.      No diagnosis found.    MDM   probale rib fractures,  Pt given morphine shot here, incentive spirometry and rx for hydrocodone.   Pt advised to see her MD for recheck in1 week  Lonia Skinner [place], [place] 01/09/12 1300

## 2012-06-10 ENCOUNTER — Encounter: Payer: Self-pay | Admitting: Lab

## 2012-06-11 ENCOUNTER — Ambulatory Visit (INDEPENDENT_AMBULATORY_CARE_PROVIDER_SITE_OTHER): Payer: Medicare Other | Admitting: Family Medicine

## 2012-06-11 ENCOUNTER — Encounter: Payer: Self-pay | Admitting: Family Medicine

## 2012-06-11 VITALS — BP 114/70 | HR 80 | Temp 98.6°F | Ht 61.0 in | Wt 144.2 lb

## 2012-06-11 DIAGNOSIS — J45909 Unspecified asthma, uncomplicated: Secondary | ICD-10-CM

## 2012-06-11 DIAGNOSIS — F17200 Nicotine dependence, unspecified, uncomplicated: Secondary | ICD-10-CM

## 2012-06-11 DIAGNOSIS — K589 Irritable bowel syndrome without diarrhea: Secondary | ICD-10-CM

## 2012-06-11 DIAGNOSIS — Z0000 Encounter for general adult medical examination without abnormal findings: Secondary | ICD-10-CM

## 2012-06-11 DIAGNOSIS — Z720 Tobacco use: Secondary | ICD-10-CM

## 2012-06-11 DIAGNOSIS — N390 Urinary tract infection, site not specified: Secondary | ICD-10-CM

## 2012-06-11 DIAGNOSIS — Z23 Encounter for immunization: Secondary | ICD-10-CM

## 2012-06-11 LAB — POCT URINALYSIS DIPSTICK
Ketones, UA: NEGATIVE
Nitrite, UA: NEGATIVE
Protein, UA: NEGATIVE
Urobilinogen, UA: 0.2

## 2012-06-11 LAB — CBC WITH DIFFERENTIAL/PLATELET
Basophils Relative: 1 % (ref 0.0–3.0)
Eosinophils Absolute: 0.4 10*3/uL (ref 0.0–0.7)
Eosinophils Relative: 4.5 % (ref 0.0–5.0)
Lymphocytes Relative: 27.6 % (ref 12.0–46.0)
MCHC: 34.2 g/dL (ref 30.0–36.0)
Neutrophils Relative %: 58.5 % (ref 43.0–77.0)
RBC: 4.7 Mil/uL (ref 3.87–5.11)
WBC: 7.8 10*3/uL (ref 4.5–10.5)

## 2012-06-11 LAB — BASIC METABOLIC PANEL
Calcium: 9.3 mg/dL (ref 8.4–10.5)
Creatinine, Ser: 0.8 mg/dL (ref 0.4–1.2)
GFR: 73.69 mL/min (ref >60.00)

## 2012-06-11 LAB — LIPID PANEL: HDL: 59.6 mg/dL (ref >39.00)

## 2012-06-11 LAB — HEPATIC FUNCTION PANEL
Alkaline Phosphatase: 65 U/L (ref 39–117)
Bilirubin, Direct: 0.1 mg/dL (ref 0.0–0.3)
Total Protein: 7.4 g/dL (ref 6.0–8.3)

## 2012-06-11 LAB — LDL CHOLESTEROL, DIRECT: Direct LDL: 105.5 mg/dL

## 2012-06-11 MED ORDER — NICOTINE 10 MG IN INHA: 1.0000 | RESPIRATORY_TRACT | Status: DC | PRN

## 2012-06-11 NOTE — Patient Instructions (Signed)
Preventive Care for Adults, Female A healthy lifestyle and preventive care can promote health and wellness. Preventive health guidelines for women include the following key practices.  A routine yearly physical is a good way to check with your caregiver about your health and preventive screening. It is a chance to share any concerns and updates on your health, and to receive a thorough exam.  Visit your dentist for a routine exam and preventive care every 6 months. Brush your teeth twice a day and floss once a day. Good oral hygiene prevents tooth decay and gum disease.  The frequency of eye exams is based on your age, health, family medical history, use of contact lenses, and other factors. Follow your caregiver's recommendations for frequency of eye exams.  Eat a healthy diet. Foods like vegetables, fruits, whole grains, low-fat dairy products, and lean protein foods contain the nutrients you need without too many calories. Decrease your intake of foods high in solid fats, added sugars, and salt. Eat the right amount of calories for you.Get information about a proper diet from your caregiver, if necessary.  Regular physical exercise is one of the most important things you can do for your health. Most adults should get at least 150 minutes of moderate-intensity exercise (any activity that increases your heart rate and causes you to sweat) each week. In addition, most adults need muscle-strengthening exercises on 2 or more days a week.  Maintain a healthy weight. The body mass index (BMI) is a screening tool to identify possible weight problems. It provides an estimate of body fat based on height and weight. Your caregiver can help determine your BMI, and can help you achieve or maintain a healthy weight.For adults 20 years and older:  A BMI below 18.5 is considered underweight.  A BMI of 18.5 to 24.9 is normal.  A BMI of 25 to 29.9 is considered overweight.  A BMI of 30 and above is  considered obese.  Maintain normal blood lipids and cholesterol levels by exercising and minimizing your intake of saturated fat. Eat a balanced diet with plenty of fruit and vegetables. Blood tests for lipids and cholesterol should begin at age 20 and be repeated every 5 years. If your lipid or cholesterol levels are high, you are over 50, or you are at high risk for heart disease, you may need your cholesterol levels checked more frequently.Ongoing high lipid and cholesterol levels should be treated with medicines if diet and exercise are not effective.  If you smoke, find out from your caregiver how to quit. If you do not use tobacco, do not start.  If you are pregnant, do not drink alcohol. If you are breastfeeding, be very cautious about drinking alcohol. If you are not pregnant and choose to drink alcohol, do not exceed 1 drink per day. One drink is considered to be 12 ounces (355 mL) of beer, 5 ounces (148 mL) of wine, or 1.5 ounces (44 mL) of liquor.  Avoid use of street drugs. Do not share needles with anyone. Ask for help if you need support or instructions about stopping the use of drugs.  High blood pressure causes heart disease and increases the risk of stroke. Your blood pressure should be checked at least every 1 to 2 years. Ongoing high blood pressure should be treated with medicines if weight loss and exercise are not effective.  If you are 55 to 68 years old, ask your caregiver if you should take aspirin to prevent strokes.  Diabetes   screening involves taking a blood sample to check your fasting blood sugar level. This should be done once every 3 years, after age 45, if you are within normal weight and without risk factors for diabetes. Testing should be considered at a younger age or be carried out more frequently if you are overweight and have at least 1 risk factor for diabetes.  Breast cancer screening is essential preventive care for women. You should practice "breast  self-awareness." This means understanding the normal appearance and feel of your breasts and may include breast self-examination. Any changes detected, no matter how small, should be reported to a caregiver. Women in their 20s and 30s should have a clinical breast exam (CBE) by a caregiver as part of a regular health exam every 1 to 3 years. After age 40, women should have a CBE every year. Starting at age 40, women should consider having a mammography (breast X-ray test) every year. Women who have a family history of breast cancer should talk to their caregiver about genetic screening. Women at a high risk of breast cancer should talk to their caregivers about having magnetic resonance imaging (MRI) and a mammography every year.  The Pap test is a screening test for cervical cancer. A Pap test can show cell changes on the cervix that might become cervical cancer if left untreated. A Pap test is a procedure in which cells are obtained and examined from the lower end of the uterus (cervix).  Women should have a Pap test starting at age 21.  Between ages 21 and 29, Pap tests should be repeated every 2 years.  Beginning at age 30, you should have a Pap test every 3 years as long as the past 3 Pap tests have been normal.  Some women have medical problems that increase the chance of getting cervical cancer. Talk to your caregiver about these problems. It is especially important to talk to your caregiver if a new problem develops soon after your last Pap test. In these cases, your caregiver may recommend more frequent screening and Pap tests.  The above recommendations are the same for women who have or have not gotten the vaccine for human papillomavirus (HPV).  If you had a hysterectomy for a problem that was not cancer or a condition that could lead to cancer, then you no longer need Pap tests. Even if you no longer need a Pap test, a regular exam is a good idea to make sure no other problems are  starting.  If you are between ages 65 and 70, and you have had normal Pap tests going back 10 years, you no longer need Pap tests. Even if you no longer need a Pap test, a regular exam is a good idea to make sure no other problems are starting.  If you have had past treatment for cervical cancer or a condition that could lead to cancer, you need Pap tests and screening for cancer for at least 20 years after your treatment.  If Pap tests have been discontinued, risk factors (such as a new sexual partner) need to be reassessed to determine if screening should be resumed.  The HPV test is an additional test that may be used for cervical cancer screening. The HPV test looks for the virus that can cause the cell changes on the cervix. The cells collected during the Pap test can be tested for HPV. The HPV test could be used to screen women aged 30 years and older, and should   be used in women of any age who have unclear Pap test results. After the age of 30, women should have HPV testing at the same frequency as a Pap test.  Colorectal cancer can be detected and often prevented. Most routine colorectal cancer screening begins at the age of 50 and continues through age 75. However, your caregiver may recommend screening at an earlier age if you have risk factors for colon cancer. On a yearly basis, your caregiver may provide home test kits to check for hidden blood in the stool. Use of a small camera at the end of a tube, to directly examine the colon (sigmoidoscopy or colonoscopy), can detect the earliest forms of colorectal cancer. Talk to your caregiver about this at age 50, when routine screening begins. Direct examination of the colon should be repeated every 5 to 10 years through age 75, unless early forms of pre-cancerous polyps or small growths are found.  Hepatitis C blood testing is recommended for all people born from 1945 through 1965 and any individual with known risks for hepatitis C.  Practice  safe sex. Use condoms and avoid high-risk sexual practices to reduce the spread of sexually transmitted infections (STIs). STIs include gonorrhea, chlamydia, syphilis, trichomonas, herpes, HPV, and human immunodeficiency virus (HIV). Herpes, HIV, and HPV are viral illnesses that have no cure. They can result in disability, cancer, and death. Sexually active women aged 25 and younger should be checked for chlamydia. Older women with new or multiple partners should also be tested for chlamydia. Testing for other STIs is recommended if you are sexually active and at increased risk.  Osteoporosis is a disease in which the bones lose minerals and strength with aging. This can result in serious bone fractures. The risk of osteoporosis can be identified using a bone density scan. Women ages 65 and over and women at risk for fractures or osteoporosis should discuss screening with their caregivers. Ask your caregiver whether you should take a calcium supplement or vitamin D to reduce the rate of osteoporosis.  Menopause can be associated with physical symptoms and risks. Hormone replacement therapy is available to decrease symptoms and risks. You should talk to your caregiver about whether hormone replacement therapy is right for you.  Use sunscreen with sun protection factor (SPF) of 30 or more. Apply sunscreen liberally and repeatedly throughout the day. You should seek shade when your shadow is shorter than you. Protect yourself by wearing long sleeves, pants, a wide-brimmed hat, and sunglasses year round, whenever you are outdoors.  Once a month, do a whole body skin exam, using a mirror to look at the skin on your back. Notify your caregiver of new moles, moles that have irregular borders, moles that are larger than a pencil eraser, or moles that have changed in shape or color.  Stay current with required immunizations.  Influenza. You need a dose every fall (or winter). The composition of the flu vaccine  changes each year, so being vaccinated once is not enough.  Pneumococcal polysaccharide. You need 1 to 2 doses if you smoke cigarettes or if you have certain chronic medical conditions. You need 1 dose at age 65 (or older) if you have never been vaccinated.  Tetanus, diphtheria, pertussis (Tdap, Td). Get 1 dose of Tdap vaccine if you are younger than age 65, are over 65 and have contact with an infant, are a healthcare worker, are pregnant, or simply want to be protected from whooping cough. After that, you need a Td   booster dose every 10 years. Consult your caregiver if you have not had at least 3 tetanus and diphtheria-containing shots sometime in your life or have a deep or dirty wound.  HPV. You need this vaccine if you are a woman age 26 or younger. The vaccine is given in 3 doses over 6 months.  Measles, mumps, rubella (MMR). You need at least 1 dose of MMR if you were born in 1957 or later. You may also need a second dose.  Meningococcal. If you are age 19 to 21 and a first-year college student living in a residence hall, or have one of several medical conditions, you need to get vaccinated against meningococcal disease. You may also need additional booster doses.  Zoster (shingles). If you are age 60 or older, you should get this vaccine.  Varicella (chickenpox). If you have never had chickenpox or you were vaccinated but received only 1 dose, talk to your caregiver to find out if you need this vaccine.  Hepatitis A. You need this vaccine if you have a specific risk factor for hepatitis A virus infection or you simply wish to be protected from this disease. The vaccine is usually given as 2 doses, 6 to 18 months apart.  Hepatitis B. You need this vaccine if you have a specific risk factor for hepatitis B virus infection or you simply wish to be protected from this disease. The vaccine is given in 3 doses, usually over 6 months. Preventive Services / Frequency Ages 19 to 39  Blood  pressure check.** / Every 1 to 2 years.  Lipid and cholesterol check.** / Every 5 years beginning at age 20.  Clinical breast exam.** / Every 3 years for women in their 20s and 30s.  Pap test.** / Every 2 years from ages 21 through 29. Every 3 years starting at age 30 through age 65 or 70 with a history of 3 consecutive normal Pap tests.  HPV screening.** / Every 3 years from ages 30 through ages 65 to 70 with a history of 3 consecutive normal Pap tests.  Hepatitis C blood test.** / For any individual with known risks for hepatitis C.  Skin self-exam. / Monthly.  Influenza immunization.** / Every year.  Pneumococcal polysaccharide immunization.** / 1 to 2 doses if you smoke cigarettes or if you have certain chronic medical conditions.  Tetanus, diphtheria, pertussis (Tdap, Td) immunization. / A one-time dose of Tdap vaccine. After that, you need a Td booster dose every 10 years.  HPV immunization. / 3 doses over 6 months, if you are 26 and younger.  Measles, mumps, rubella (MMR) immunization. / You need at least 1 dose of MMR if you were born in 1957 or later. You may also need a second dose.  Meningococcal immunization. / 1 dose if you are age 19 to 21 and a first-year college student living in a residence hall, or have one of several medical conditions, you need to get vaccinated against meningococcal disease. You may also need additional booster doses.  Varicella immunization.** / Consult your caregiver.  Hepatitis A immunization.** / Consult your caregiver. 2 doses, 6 to 18 months apart.  Hepatitis B immunization.** / Consult your caregiver. 3 doses usually over 6 months. Ages 40 to 64  Blood pressure check.** / Every 1 to 2 years.  Lipid and cholesterol check.** / Every 5 years beginning at age 20.  Clinical breast exam.** / Every year after age 40.  Mammogram.** / Every year beginning at age 40   and continuing for as long as you are in good health. Consult with your  caregiver.  Pap test.** / Every 3 years starting at age 30 through age 65 or 70 with a history of 3 consecutive normal Pap tests.  HPV screening.** / Every 3 years from ages 30 through ages 65 to 70 with a history of 3 consecutive normal Pap tests.  Fecal occult blood test (FOBT) of stool. / Every year beginning at age 50 and continuing until age 75. You may not need to do this test if you get a colonoscopy every 10 years.  Flexible sigmoidoscopy or colonoscopy.** / Every 5 years for a flexible sigmoidoscopy or every 10 years for a colonoscopy beginning at age 50 and continuing until age 75.  Hepatitis C blood test.** / For all people born from 1945 through 1965 and any individual with known risks for hepatitis C.  Skin self-exam. / Monthly.  Influenza immunization.** / Every year.  Pneumococcal polysaccharide immunization.** / 1 to 2 doses if you smoke cigarettes or if you have certain chronic medical conditions.  Tetanus, diphtheria, pertussis (Tdap, Td) immunization.** / A one-time dose of Tdap vaccine. After that, you need a Td booster dose every 10 years.  Measles, mumps, rubella (MMR) immunization. / You need at least 1 dose of MMR if you were born in 1957 or later. You may also need a second dose.  Varicella immunization.** / Consult your caregiver.  Meningococcal immunization.** / Consult your caregiver.  Hepatitis A immunization.** / Consult your caregiver. 2 doses, 6 to 18 months apart.  Hepatitis B immunization.** / Consult your caregiver. 3 doses, usually over 6 months. Ages 65 and over  Blood pressure check.** / Every 1 to 2 years.  Lipid and cholesterol check.** / Every 5 years beginning at age 20.  Clinical breast exam.** / Every year after age 40.  Mammogram.** / Every year beginning at age 40 and continuing for as long as you are in good health. Consult with your caregiver.  Pap test.** / Every 3 years starting at age 30 through age 65 or 70 with a 3  consecutive normal Pap tests. Testing can be stopped between 65 and 70 with 3 consecutive normal Pap tests and no abnormal Pap or HPV tests in the past 10 years.  HPV screening.** / Every 3 years from ages 30 through ages 65 or 70 with a history of 3 consecutive normal Pap tests. Testing can be stopped between 65 and 70 with 3 consecutive normal Pap tests and no abnormal Pap or HPV tests in the past 10 years.  Fecal occult blood test (FOBT) of stool. / Every year beginning at age 50 and continuing until age 75. You may not need to do this test if you get a colonoscopy every 10 years.  Flexible sigmoidoscopy or colonoscopy.** / Every 5 years for a flexible sigmoidoscopy or every 10 years for a colonoscopy beginning at age 50 and continuing until age 75.  Hepatitis C blood test.** / For all people born from 1945 through 1965 and any individual with known risks for hepatitis C.  Osteoporosis screening.** / A one-time screening for women ages 65 and over and women at risk for fractures or osteoporosis.  Skin self-exam. / Monthly.  Influenza immunization.** / Every year.  Pneumococcal polysaccharide immunization.** / 1 dose at age 65 (or older) if you have never been vaccinated.  Tetanus, diphtheria, pertussis (Tdap, Td) immunization. / A one-time dose of Tdap vaccine if you are over   65 and have contact with an infant, are a healthcare worker, or simply want to be protected from whooping cough. After that, you need a Td booster dose every 10 years.  Varicella immunization.** / Consult your caregiver.  Meningococcal immunization.** / Consult your caregiver.  Hepatitis A immunization.** / Consult your caregiver. 2 doses, 6 to 18 months apart.  Hepatitis B immunization.** / Check with your caregiver. 3 doses, usually over 6 months. ** Family history and personal history of risk and conditions may change your caregiver's recommendations. Document Released: 03/06/2001 Document Revised: 04/02/2011  Document Reviewed: 06/05/2010 [employer] Patient Information 2014 [employer]  

## 2012-06-11 NOTE — Progress Notes (Signed)
Subjective:    Penny Lamb is a 67 y.o. female who presents for Medicare Annual/Subsequent preventive examination.  Preventive Screening-Counseling & Management  Tobacco History  Smoking status  . Smoker, Current Status Unknown -- 0.50 packs/day for 52 years  Smokeless tobacco  . Not on file     Problems Prior to Visit 1. none  Current Problems (verified) Patient Active Problem List   Diagnosis Date Noted  . Rib pain on right side 01/09/2012  . VAGINITIS, ATROPHIC 02/28/2010  . CYSTITIS 02/21/2010  . HEMATURIA UNSPECIFIED 02/21/2010  . FOOT PAIN, LEFT 10/21/2009  . DEPRESSION 09/14/2009  . ALLERGIC RHINITIS 09/14/2009  . ASTHMA 09/14/2009  . ARTIFICIAL MENOPAUSE 09/14/2009  . OSTEOARTHRITIS 09/14/2009  . HEADACHE 09/14/2009  . SKIN CANCER, HX OF 09/14/2009  . NEPHROLITHIASIS, HX OF 09/14/2009    Medications Prior to Visit Current Outpatient Prescriptions on File Prior to Visit  Medication Sig Dispense Refill  . Calcium Carbonate-Vitamin D (CALCIUM-VITAMIN D) 600-200 MG-UNIT CAPS Take by mouth 2 (two) times daily.        Marland Kitchen dicyclomine (BENTYL) 10 MG capsule 1-2 tabs po tid prn  90 capsule  0   No current facility-administered medications on file prior to visit.    Current Medications (verified) Current Outpatient Prescriptions  Medication Sig Dispense Refill  . Calcium Carbonate-Vitamin D (CALCIUM-VITAMIN D) 600-200 MG-UNIT CAPS Take by mouth 2 (two) times daily.        Marland Kitchen dicyclomine (BENTYL) 10 MG capsule 1-2 tabs po tid prn  90 capsule  0  . Multiple Vitamins-Minerals (HAIR/SKIN/NAILS) TABS Take 1 tablet by mouth daily.      . vitamin E 1000 UNIT capsule Take 1,000 Units by mouth daily.       No current facility-administered medications for this visit.     Allergies (verified) Bactrim; Codeine; and Hydrocodone   PAST HISTORY  Family History Family History  Problem Relation Age of Onset  . Cancer Other     SKIN  . Asthma Other   .  Arthritis Other   . Hypertension Other   . Dementia Mother   . Dementia Brother     Social History History  Substance Use Topics  . Smoking status: Smoker, Current Status Unknown -- 0.50 packs/day for 52 years  . Smokeless tobacco: Not on file  . Alcohol Use: No     Are there smokers in your home (other than you)? No  Risk Factors Current exercise habits: walking dogs 3x a day  Dietary issues discussed: na   Cardiac risk factors: advanced age (older than 42 for men, 84 for women) and smoking/ tobacco exposure.  Depression Screen (Note: if answer to either of the following is "Yes", a more complete depression screening is indicated)   Over the past two weeks, have you felt down, depressed or hopeless? No  Over the past two weeks, have you felt little interest or pleasure in doing things? No  Have you lost interest or pleasure in daily life? No  Do you often feel hopeless? No  Do you cry easily over simple problems? No  Activities of Daily Living In your present state of health, do you have any difficulty performing the following activities?:  Driving? No Managing money?  No Feeding yourself? NoClimbing a flight of stairs? No Preparing food and eating?: No Bathing or showering? No Getting dressed: No Getting to the toilet? No Using the toilet:No Moving around from place to place: No In the past year have you fallen or  had a near fall?:No   Are you sexually active?  No  Do you have more than one partner?  No  Hearing Difficulties: No Do you often ask people to speak up or repeat themselves? No Do you experience ringing or noises in your ears? No Do you have difficulty understanding soft or whispered voices? No   Do you feel that you have a problem with memory? No  Do you often misplace items? No  Do you feel safe at home?  No  Cognitive Testing  Alert? Yes  Normal Appearance?Yes  Oriented to person? Yes  Place? Yes   Time? Yes  Recall of three objects?   Yes  Can perform simple calculations? Yes  Displays appropriate judgment?Yes  Can read the correct time from a watch face?Yes   Advanced Directives have been discussed with the patient? Yes  List the Names of Other Physician/Practitioners you currently use: 1.  Derm--[place] [facility] Dr Sharol Given any recent Medical Services you may have received from other than [facility] providers in the past year (date may be approximate).  Immunization History  Administered Date(s) Administered  . Hepatitis B 10/26/2009  . Influenza Split 11/15/2010, 10/25/2011  . Influenza Whole 10/21/2009  . Pneumococcal Polysaccharide 10/08/2007  . Zoster 10/21/2009    Screening Tests Health Maintenance  Topic Date Due  . Influenza Vaccine  09/22/2012  . Mammogram  11/19/2013  . Tetanus/tdap  11/15/2015  . Colonoscopy  11/14/2017  . Pneumococcal Polysaccharide Vaccine Age 24 And Over  Completed  . Zostavax  Completed    All answers were reviewed with the patient and necessary referrals were made:  Loreen Freud, DO   06/11/2012   History reviewed:  She  has a past medical history of UTI (urinary tract infection); IBS (irritable bowel syndrome); Asthma; Arthritis; Cancer; Depression; Allergy; Headache; and Nephrolithiasis. She  does not have any pertinent problems on file. She  has past surgical history that includes Abdominal hysterectomy; Breast surgery; and Eye surgery (Left, 2013). Her family history includes Arthritis in her other; Asthma in her other; Cancer in her other; Dementia in her brother and mother; and Hypertension in her other. She  reports that she has been smoking.  She does not have any smokeless tobacco history on file. She reports that she does not drink alcohol or use illicit drugs. She has a current medication list which includes the following prescription(s): calcium-vitamin d, dicyclomine, hair/skin/nails, and vitamin e. Current Outpatient Prescriptions  on File Prior to Visit  Medication Sig Dispense Refill  . Calcium Carbonate-Vitamin D (CALCIUM-VITAMIN D) 600-200 MG-UNIT CAPS Take by mouth 2 (two) times daily.        Marland Kitchen dicyclomine (BENTYL) 10 MG capsule 1-2 tabs po tid prn  90 capsule  0   No current facility-administered medications on file prior to visit.   She is allergic to bactrim; codeine; and hydrocodone.  Review of Systems  Review of Systems  Constitutional: Negative for activity change, appetite change and fatigue.  HENT: Negative for hearing loss, congestion, tinnitus and ear discharge.   Eyes: Negative for visual disturbance (see optho q1y -- vision corrected to 20/20 with glasses).  Respiratory: Negative for cough, chest tightness and shortness of breath.   Cardiovascular: Negative for chest pain, palpitations and leg swelling.  Gastrointestinal: Negative for abdominal pain, diarrhea, constipation and abdominal distention.  Genitourinary: Negative for urgency, frequency, decreased urine volume and difficulty urinating.  Musculoskeletal: Negative for back pain, arthralgias and gait problem.  Skin: Negative for color change, pallor and rash.  Neurological: Negative for dizziness, light-headedness, numbness and headaches.  Hematological: Negative for adenopathy. Does not bruise/bleed easily.  Psychiatric/Behavioral: Negative for suicidal ideas, confusion, sleep disturbance, self-injury, dysphoric mood, decreased concentration and agitation.  Pt is able to read and write and can do all ADLs No risk for falling No abuse/ violence in home      Objective:     Vision by Snellen chart: opth Body mass index is 27.26 kg/(m^2). BP 114/70  Pulse 80  Temp(Src) 98.6 F (37 C) (Oral)  Ht 5\' 1"  (1.549 m)  Wt 144 lb 3.2 oz (65.409 kg)  BMI 27.26 kg/m2  SpO2 96%  BP 114/70  Pulse 80  Temp(Src) 98.6 F (37 C) (Oral)  Ht 5\' 1"  (1.549 m)  Wt 144 lb 3.2 oz (65.409 kg)  BMI 27.26 kg/m2  SpO2 96% General appearance: alert,  cooperative, appears stated age and no distress Head: Normocephalic, without obvious abnormality, atraumatic Eyes: negative findings: lids and lashes normal, conjunctivae and sclerae normal and pupils equal, round, reactive to light and accomodation Ears: normal TM's and external ear canals both ears Nose: Nares normal. Septum midline. Mucosa normal. No drainage or sinus tenderness. Throat: lips, mucosa, and tongue normal; teeth and gums normal Neck: no adenopathy, no carotid bruit, no JVD, supple, symmetrical, trachea midline and thyroid not enlarged, symmetric, no tenderness/mass/nodules Back: symmetric, no curvature. ROM normal. No CVA tenderness. Lungs: clear to auscultation bilaterally Breasts: normal appearance, no masses or tenderness Heart: regular rate and rhythm, S1, S2 normal, no murmur, click, rub or gallop Abdomen: soft, non-tender; bowel sounds normal; no masses,  no organomegaly Pelvic: not indicated; post-menopausal, no abnormal Pap smears in past Extremities: extremities normal, atraumatic, no cyanosis or edema Pulses: 2+ and symmetric Skin: Skin color, texture, turgor normal. No rashes or lesions Lymph nodes: Cervical, supraclavicular, and axillary nodes normal. Neurologic: Alert and oriented X 3, normal strength and tone. Normal symmetric reflexes. Normal coordination and gait Psych-- no depression, no anxiety      Assessment:     cpe     Plan:     During the course of the visit the patient was educated and counseled about appropriate screening and preventive services including:    Pneumococcal vaccine   Influenza vaccine  Screening mammography  Bone densitometry screening  Colorectal cancer screening  Diabetes screening  Glaucoma screening  Smoking cessation counseling  Advanced directives: has NO advanced directive  - add't info requested. Referral to SW: pt given info / coupons for smoking cessation  Diet review for nutrition referral? Yes ____   Not Indicated __x__   Patient Instructions (the written plan) was given to the patient.  Medicare Attestation I have personally reviewed: The patient's medical and social history Their use of alcohol, tobacco or illicit drugs Their current medications and supplements The patient's functional ability including ADLs,fall risks, home safety risks, cognitive, and hearing and visual impairment Diet and physical activities Evidence for depression or mood disorders  The patient's weight, height, BMI, and visual acuity have been recorded in the chart.  I have made referrals, counseling, and provided education to the patient based on review of the above and I have provided the patient with a written personalized care plan for preventive services.     Loreen Freud, DO   06/11/2012

## 2012-06-11 NOTE — Assessment & Plan Note (Signed)
Cont meds   

## 2012-06-11 NOTE — Addendum Note (Signed)
Addended by: Silvio Pate D on: 06/11/2012 04:36 PM   Modules accepted: Orders

## 2012-06-12 ENCOUNTER — Telehealth: Payer: Self-pay | Admitting: Family Medicine

## 2012-06-12 NOTE — Telephone Encounter (Signed)
Vaccination reaction noted. Will wait to see if pt calls back.

## 2012-06-12 NOTE — Telephone Encounter (Signed)
Patient Information:  Caller Name: Jenan  Phone: [phone]  Patient: Ameena, Vesey  Gender: Female  DOB: 1945-12-17  Age: 67 Years  PCP: Lelon Perla.  Office Follow Up:  Does the office need to follow up with this patient?: No  Instructions For The Office: N/A   Symptoms  Reason For Call & Symptoms: Pt received the pneumonia immunization on 06/11/12 and is having a reaction.  Pt has red patches on her on her left upper arm and shoulder.  Areas are itchy and tender.  Left upper arm tender.  On 06/11/12 pt felt slightly nauseated and lightheaded.  No respiratory distress. Afebrile.    Reviewed Health History In EMR: Yes  Reviewed Medications In EMR: Yes  Reviewed Allergies In EMR: Yes  Reviewed Surgeries / Procedures: Yes  Date of Onset of Symptoms: 06/11/2012  Treatments Tried: Tylenol  Treatments Tried Worked: yes  Guideline(s) Used:  Immunization Reactions  Disposition Per Guideline:   Home Care  Reason For Disposition Reached:   Mild immunization reaction  Advice Given:  Pneumococcal Vaccine:  Mild pain, tenderness, swelling, OR redness at the injection site (in 50%).  Fever, muscle aches lasting for 1-2 days (in 1%).  Cold Pack for Local Reaction at Injection Site:  Apply a cold pack or ice in a wet washcloth to the area for 20 minutes. Repeat in 1 hour.  Then apply as needed for the first 48 hours after the injection (Reason: reduce the pain and swelling).  Pain and Fever Medicines:  For pain or fever relief, take either acetaminophen or ibuprofen.  Call Back If:  Pain lasts more than 3 days  Injection site starts to look infected  You become worse.  Patient Will Follow Care Advice:  YES

## 2012-07-04 ENCOUNTER — Encounter: Payer: Self-pay | Admitting: Family Medicine

## 2012-09-30 ENCOUNTER — Other Ambulatory Visit: Payer: Self-pay | Admitting: Family Medicine

## 2012-09-30 DIAGNOSIS — N6310 Unspecified lump in the right breast, unspecified quadrant: Secondary | ICD-10-CM

## 2012-10-13 ENCOUNTER — Ambulatory Visit
Admission: RE | Admit: 2012-10-13 | Discharge: 2012-10-13 | Disposition: A | Discharge status: Home / Self Care | Payer: Medicare Other | Source: Ambulatory Visit | Attending: Family Medicine | Admitting: Family Medicine

## 2012-10-13 DIAGNOSIS — N6310 Unspecified lump in the right breast, unspecified quadrant: Secondary | ICD-10-CM

## 2012-10-16 ENCOUNTER — Ambulatory Visit (INDEPENDENT_AMBULATORY_CARE_PROVIDER_SITE_OTHER): Payer: Medicare Other

## 2012-10-16 DIAGNOSIS — Z23 Encounter for immunization: Secondary | ICD-10-CM

## 2012-12-31 ENCOUNTER — Ambulatory Visit (INDEPENDENT_AMBULATORY_CARE_PROVIDER_SITE_OTHER): Payer: Medicare Other | Admitting: Family Medicine

## 2012-12-31 ENCOUNTER — Encounter: Payer: Self-pay | Admitting: Family Medicine

## 2012-12-31 VITALS — BP 120/70 | HR 90 | Temp 98.5°F | Wt 144.0 lb

## 2012-12-31 DIAGNOSIS — A6000 Herpesviral infection of urogenital system, unspecified: Secondary | ICD-10-CM

## 2012-12-31 DIAGNOSIS — F17200 Nicotine dependence, unspecified, uncomplicated: Secondary | ICD-10-CM

## 2012-12-31 DIAGNOSIS — I781 Nevus, non-neoplastic: Secondary | ICD-10-CM

## 2012-12-31 MED ORDER — VALACYCLOVIR HCL 1 G PO TABS: 1000.0000 mg | ORAL_TABLET | Freq: Every day | ORAL | Status: DC

## 2012-12-31 MED ORDER — VARENICLINE TARTRATE 1 MG PO TABS: 1.0000 mg | ORAL_TABLET | Freq: Two times a day (BID) | ORAL | Status: DC

## 2012-12-31 NOTE — Patient Instructions (Signed)
Genital Herpes  Genital herpes is a sexually transmitted disease. This means that it is a disease passed by having sex with an infected person. There is no cure for genital herpes. The time between attacks can be months to years. The virus may live in a person but produce no problems (symptoms). This infection can be passed to a baby as it travels down the birth canal (vagina). In a newborn, this can cause central nervous system damage, eye damage, or even death. The virus that causes genital herpes is usually HSV-2 virus. The virus that causes oral herpes is usually HSV-1. The diagnosis (learning what is wrong) is made through culture results.  SYMPTOMS   Usually symptoms of pain and itching begin a few days to a week after contact. It first appears as small blisters that progress to small painful ulcers which then scab over and heal after several days. It affects the outer genitalia, birth canal, cervix, penis, anal area, buttocks, and thighs.  HOME CARE INSTRUCTIONS   · Keep ulcerated areas dry and clean.  · Take medications as directed. Antiviral medications can speed up healing. They will not prevent recurrences or cure this infection. These medications can also be taken for suppression if there are frequent recurrences.  · While the infection is active, it is contagious. Avoid all sexual contact during active infections.  · Condoms may help prevent spread of the herpes virus.  · Practice safe sex.  · Wash your hands thoroughly after touching the genital area.  · Avoid touching your eyes after touching your genital area.  · Inform your caregiver if you have had genital herpes and become pregnant. It is your responsibility to insure a safe outcome for your baby in this pregnancy.  · Only take over-the-counter or prescription medicines for pain, discomfort, or fever as directed by your caregiver.  SEEK MEDICAL CARE IF:   · You have a recurrence of this infection.  · You do not respond to medications and are not  improving.  · You have new sources of pain or discharge which have changed from the original infection.  · You have an oral temperature above 102° F (38.9° C).  · You develop abdominal pain.  · You develop eye pain or signs of eye infection.  Document Released: 01/06/2000 Document Revised: 04/02/2011 Document Reviewed: 01/26/2009  [employer]® Patient Information ©2014 [employer].

## 2012-12-31 NOTE — Progress Notes (Signed)
Pre visit review using our clinic review tool, if applicable. No additional management support is needed unless otherwise documented below in the visit note. 

## 2012-12-31 NOTE — Progress Notes (Signed)
   Subjective:    Patient ID: Penny Lamb, female    DOB: 09-22-1945, 67 y.o.   MRN: [id number]  HPI Pt here to discuss dx of genital herpes.  She has had the diagnosis for several years but never had severe symptoms and was not sexually active so she did not feel the need to tell anyone.  She has met someone and would like to be sexually active.  He is aware of her diagnosis and she would like to get medication to help.   Review of Systems As above    Objective:   Physical Exam BP 120/70  Pulse 90  Temp(Src) 98.5 F (36.9 C) (Oral)  Wt 144 lb (65.318 kg)  SpO2 96% General appearance: alert, cooperative, appears stated age and no distress Abdomen: soft, non-tender; bowel sounds normal; no masses,  no organomegaly       Assessment & Plan:

## 2013-01-01 ENCOUNTER — Telehealth: Payer: Self-pay | Admitting: Family Medicine

## 2013-01-01 DIAGNOSIS — I781 Nevus, non-neoplastic: Secondary | ICD-10-CM

## 2013-01-01 NOTE — Telephone Encounter (Signed)
Patient has an appointment with [facility] today and they are calling requesting a referral. Patient's original referral was sent to [facility] but she declined an appointment with them when they called and made her own appointment. Referral was cancelled in system. New referral can be sent to Fax#: [phone].

## 2013-01-01 NOTE — Telephone Encounter (Signed)
Ref put in and faxed      KP

## 2013-02-12 ENCOUNTER — Telehealth: Payer: Self-pay

## 2013-02-12 NOTE — Telephone Encounter (Signed)
MSG on VM from patient who stated she has a toenail fungus and she wanted to get treated. She said she was told in the past to never use Lamisil again by another provider after using it in the past. I called the patient back and left her a message to call the office and schedule an appointment for an evaluation.      KP

## 2013-07-14 ENCOUNTER — Ambulatory Visit: Payer: Medicare Other | Admitting: Family Medicine

## 2013-10-16 ENCOUNTER — Encounter: Payer: Self-pay | Admitting: Family Medicine

## 2013-10-23 ENCOUNTER — Ambulatory Visit (INDEPENDENT_AMBULATORY_CARE_PROVIDER_SITE_OTHER): Payer: Medicare Other | Admitting: Family Medicine

## 2013-10-23 ENCOUNTER — Encounter: Payer: Self-pay | Admitting: Family Medicine

## 2013-10-23 VITALS — BP 122/72 | HR 73 | Temp 98.0°F | Ht 61.0 in | Wt 138.9 lb

## 2013-10-23 DIAGNOSIS — Z1239 Encounter for other screening for malignant neoplasm of breast: Secondary | ICD-10-CM

## 2013-10-23 DIAGNOSIS — R7989 Other specified abnormal findings of blood chemistry: Secondary | ICD-10-CM

## 2013-10-23 DIAGNOSIS — Z0000 Encounter for general adult medical examination without abnormal findings: Secondary | ICD-10-CM

## 2013-10-23 DIAGNOSIS — Z23 Encounter for immunization: Secondary | ICD-10-CM

## 2013-10-23 DIAGNOSIS — E2839 Other primary ovarian failure: Secondary | ICD-10-CM

## 2013-10-23 DIAGNOSIS — Z136 Encounter for screening for cardiovascular disorders: Secondary | ICD-10-CM

## 2013-10-23 LAB — HEPATIC FUNCTION PANEL
ALBUMIN: 4.2 g/dL (ref 3.5–5.2)
ALT: 16 U/L (ref 0–35)
AST: 22 U/L (ref 0–37)
Alkaline Phosphatase: 61 U/L (ref 39–117)
BILIRUBIN DIRECT: 0.1 mg/dL (ref 0.0–0.3)
TOTAL PROTEIN: 7.4 g/dL (ref 6.0–8.3)
Total Bilirubin: 0.8 mg/dL (ref 0.2–1.2)

## 2013-10-23 LAB — BASIC METABOLIC PANEL
BUN: 15 mg/dL (ref 6–23)
CHLORIDE: 103 meq/L (ref 96–112)
CO2: 30 meq/L (ref 19–32)
Calcium: 9.4 mg/dL (ref 8.4–10.5)
Creatinine, Ser: 0.7 mg/dL (ref 0.4–1.2)
GFR: 91.09 mL/min (ref >60.00)
Glucose, Bld: 86 mg/dL (ref 70–99)
POTASSIUM: 4 meq/L (ref 3.5–5.1)
SODIUM: 138 meq/L (ref 135–145)

## 2013-10-23 LAB — CBC WITH DIFFERENTIAL/PLATELET
BASOS PCT: 0.6 % (ref 0.0–3.0)
Basophils Absolute: 0 10*3/uL (ref 0.0–0.1)
EOS PCT: 3.2 % (ref 0.0–5.0)
Eosinophils Absolute: 0.2 10*3/uL (ref 0.0–0.7)
HEMATOCRIT: 43.8 % (ref 36.0–46.0)
HEMOGLOBIN: 14.8 g/dL (ref 12.0–15.0)
LYMPHS ABS: 2.1 10*3/uL (ref 0.7–4.0)
Lymphocytes Relative: 29.5 % (ref 12.0–46.0)
MCHC: 33.8 g/dL (ref 30.0–36.0)
MCV: 94.5 fl (ref 78.0–100.0)
MONO ABS: 0.5 10*3/uL (ref 0.1–1.0)
Monocytes Relative: 6.9 % (ref 3.0–12.0)
NEUTROS ABS: 4.3 10*3/uL (ref 1.4–7.7)
NEUTROS PCT: 59.8 % (ref 43.0–77.0)
Platelets: 313 10*3/uL (ref 150.0–400.0)
RBC: 4.63 Mil/uL (ref 3.87–5.11)
RDW: 14.6 % (ref 11.5–15.5)
WBC: 7.2 10*3/uL (ref 4.0–10.5)

## 2013-10-23 LAB — LIPID PANEL
Cholesterol: 244 mg/dL — ABNORMAL HIGH (ref 0–200)
HDL: 54.4 mg/dL (ref >39.00)
NONHDL: 189.6
TRIGLYCERIDES: 219 mg/dL — AB (ref 0.0–149.0)
Total CHOL/HDL Ratio: 4
VLDL: 43.8 mg/dL — ABNORMAL HIGH (ref 0.0–40.0)

## 2013-10-23 LAB — LDL CHOLESTEROL, DIRECT: Direct LDL: 99.6 mg/dL

## 2013-10-23 NOTE — Patient Instructions (Signed)
Preventive Care for Adults A healthy lifestyle and preventive care can promote health and wellness. Preventive health guidelines for women include the following key practices.  A routine yearly physical is a good way to check with your health care provider about your health and preventive screening. It is a chance to share any concerns and updates on your health and to receive a thorough exam.  Visit your dentist for a routine exam and preventive care every 6 months. Brush your teeth twice a day and floss once a day. Good oral hygiene prevents tooth decay and gum disease.  The frequency of eye exams is based on your age, health, family medical history, use of contact lenses, and other factors. Follow your health care provider's recommendations for frequency of eye exams.  Eat a healthy diet. Foods like vegetables, fruits, whole grains, low-fat dairy products, and lean protein foods contain the nutrients you need without too many calories. Decrease your intake of foods high in solid fats, added sugars, and salt. Eat the right amount of calories for you.Get information about a proper diet from your health care provider, if necessary.  Regular physical exercise is one of the most important things you can do for your health. Most adults should get at least 150 minutes of moderate-intensity exercise (any activity that increases your heart rate and causes you to sweat) each week. In addition, most adults need muscle-strengthening exercises on 2 or more days a week.  Maintain a healthy weight. The body mass index (BMI) is a screening tool to identify possible weight problems. It provides an estimate of body fat based on height and weight. Your health care provider can find your BMI and can help you achieve or maintain a healthy weight.For adults 20 years and older:  A BMI below 18.5 is considered underweight.  A BMI of 18.5 to 24.9 is normal.  A BMI of 25 to 29.9 is considered overweight.  A BMI of  30 and above is considered obese.  Maintain normal blood lipids and cholesterol levels by exercising and minimizing your intake of saturated fat. Eat a balanced diet with plenty of fruit and vegetables. Blood tests for lipids and cholesterol should begin at age 76 and be repeated every 5 years. If your lipid or cholesterol levels are high, you are over 50, or you are at high risk for heart disease, you may need your cholesterol levels checked more frequently.Ongoing high lipid and cholesterol levels should be treated with medicines if diet and exercise are not working.  If you smoke, find out from your health care provider how to quit. If you do not use tobacco, do not start.  Lung cancer screening is recommended for adults aged 22-80 years who are at high risk for developing lung cancer because of a history of smoking. A yearly low-dose CT scan of the lungs is recommended for people who have at least a 30-pack-year history of smoking and are a current smoker or have quit within the past 15 years. A pack year of smoking is smoking an average of 1 pack of cigarettes a day for 1 year (for example: 1 pack a day for 30 years or 2 packs a day for 15 years). Yearly screening should continue until the smoker has stopped smoking for at least 15 years. Yearly screening should be stopped for people who develop a health problem that would prevent them from having lung cancer treatment.  If you are pregnant, do not drink alcohol. If you are breastfeeding,  be very cautious about drinking alcohol. If you are not pregnant and choose to drink alcohol, do not have more than 1 drink per day. One drink is considered to be 12 ounces (355 mL) of beer, 5 ounces (148 mL) of wine, or 1.5 ounces (44 mL) of liquor.  Avoid use of street drugs. Do not share needles with anyone. Ask for help if you need support or instructions about stopping the use of drugs.  High blood pressure causes heart disease and increases the risk of  stroke. Your blood pressure should be checked at least every 1 to 2 years. Ongoing high blood pressure should be treated with medicines if weight loss and exercise do not work.  If you are 3-86 years old, ask your health care provider if you should take aspirin to prevent strokes.  Diabetes screening involves taking a blood sample to check your fasting blood sugar level. This should be done once every 3 years, after age 67, if you are within normal weight and without risk factors for diabetes. Testing should be considered at a younger age or be carried out more frequently if you are overweight and have at least 1 risk factor for diabetes.  Breast cancer screening is essential preventive care for women. You should practice "breast self-awareness." This means understanding the normal appearance and feel of your breasts and may include breast self-examination. Any changes detected, no matter how small, should be reported to a health care provider. Women in their 8s and 30s should have a clinical breast exam (CBE) by a health care provider as part of a regular health exam every 1 to 3 years. After age 70, women should have a CBE every year. Starting at age 25, women should consider having a mammogram (breast X-ray test) every year. Women who have a family history of breast cancer should talk to their health care provider about genetic screening. Women at a high risk of breast cancer should talk to their health care providers about having an MRI and a mammogram every year.  Breast cancer gene (BRCA)-related cancer risk assessment is recommended for women who have family members with BRCA-related cancers. BRCA-related cancers include breast, ovarian, tubal, and peritoneal cancers. Having family members with these cancers may be associated with an increased risk for harmful changes (mutations) in the breast cancer genes BRCA1 and BRCA2. Results of the assessment will determine the need for genetic counseling and  BRCA1 and BRCA2 testing.  Routine pelvic exams to screen for cancer are no longer recommended for nonpregnant women who are considered low risk for cancer of the pelvic organs (ovaries, uterus, and vagina) and who do not have symptoms. Ask your health care provider if a screening pelvic exam is right for you.  If you have had past treatment for cervical cancer or a condition that could lead to cancer, you need Pap tests and screening for cancer for at least 20 years after your treatment. If Pap tests have been discontinued, your risk factors (such as having a new sexual partner) need to be reassessed to determine if screening should be resumed. Some women have medical problems that increase the chance of getting cervical cancer. In these cases, your health care provider may recommend more frequent screening and Pap tests.  The HPV test is an additional test that may be used for cervical cancer screening. The HPV test looks for the virus that can cause the cell changes on the cervix. The cells collected during the Pap test can be  tested for HPV. The HPV test could be used to screen women aged 30 years and older, and should be used in women of any age who have unclear Pap test results. After the age of 30, women should have HPV testing at the same frequency as a Pap test.  Colorectal cancer can be detected and often prevented. Most routine colorectal cancer screening begins at the age of 50 years and continues through age 75 years. However, your health care provider may recommend screening at an earlier age if you have risk factors for colon cancer. On a yearly basis, your health care provider may provide home test kits to check for hidden blood in the stool. Use of a small camera at the end of a tube, to directly examine the colon (sigmoidoscopy or colonoscopy), can detect the earliest forms of colorectal cancer. Talk to your health care provider about this at age 50, when routine screening begins. Direct  exam of the colon should be repeated every 5-10 years through age 75 years, unless early forms of pre-cancerous polyps or small growths are found.  People who are at an increased risk for hepatitis B should be screened for this virus. You are considered at high risk for hepatitis B if:  You were born in a country where hepatitis B occurs often. Talk with your health care provider about which countries are considered high risk.  Your parents were born in a high-risk country and you have not received a shot to protect against hepatitis B (hepatitis B vaccine).  You have HIV or AIDS.  You use needles to inject street drugs.  You live with, or have sex with, someone who has hepatitis B.  You get hemodialysis treatment.  You take certain medicines for conditions like cancer, organ transplantation, and autoimmune conditions.  Hepatitis C blood testing is recommended for all people born from 1945 through 1965 and any individual with known risks for hepatitis C.  Practice safe sex. Use condoms and avoid high-risk sexual practices to reduce the spread of sexually transmitted infections (STIs). STIs include gonorrhea, chlamydia, syphilis, trichomonas, herpes, HPV, and human immunodeficiency virus (HIV). Herpes, HIV, and HPV are viral illnesses that have no cure. They can result in disability, cancer, and death.  You should be screened for sexually transmitted illnesses (STIs) including gonorrhea and chlamydia if:  You are sexually active and are younger than 24 years.  You are older than 24 years and your health care provider tells you that you are at risk for this type of infection.  Your sexual activity has changed since you were last screened and you are at an increased risk for chlamydia or gonorrhea. Ask your health care provider if you are at risk.  If you are at risk of being infected with HIV, it is recommended that you take a prescription medicine daily to prevent HIV infection. This is  called preexposure prophylaxis (PrEP). You are considered at risk if:  You are a heterosexual woman, are sexually active, and are at increased risk for HIV infection.  You take drugs by injection.  You are sexually active with a partner who has HIV.  Talk with your health care provider about whether you are at high risk of being infected with HIV. If you choose to begin PrEP, you should first be tested for HIV. You should then be tested every 3 months for as long as you are taking PrEP.  Osteoporosis is a disease in which the bones lose minerals and strength   with aging. This can result in serious bone fractures or breaks. The risk of osteoporosis can be identified using a bone density scan. Women ages 65 years and over and women at risk for fractures or osteoporosis should discuss screening with their health care providers. Ask your health care provider whether you should take a calcium supplement or vitamin D to reduce the rate of osteoporosis.  Menopause can be associated with physical symptoms and risks. Hormone replacement therapy is available to decrease symptoms and risks. You should talk to your health care provider about whether hormone replacement therapy is right for you.  Use sunscreen. Apply sunscreen liberally and repeatedly throughout the day. You should seek shade when your shadow is shorter than you. Protect yourself by wearing long sleeves, pants, a wide-brimmed hat, and sunglasses year round, whenever you are outdoors.  Once a month, do a whole body skin exam, using a mirror to look at the skin on your back. Tell your health care provider of new moles, moles that have irregular borders, moles that are larger than a pencil eraser, or moles that have changed in shape or color.  Stay current with required vaccines (immunizations).  Influenza vaccine. All adults should be immunized every year.  Tetanus, diphtheria, and acellular pertussis (Td, Tdap) vaccine. Pregnant women should  receive 1 dose of Tdap vaccine during each pregnancy. The dose should be obtained regardless of the length of time since the last dose. Immunization is preferred during the 27th-36th week of gestation. An adult who has not previously received Tdap or who does not know her vaccine status should receive 1 dose of Tdap. This initial dose should be followed by tetanus and diphtheria toxoids (Td) booster doses every 10 years. Adults with an unknown or incomplete history of completing a 3-dose immunization series with Td-containing vaccines should begin or complete a primary immunization series including a Tdap dose. Adults should receive a Td booster every 10 years.  Varicella vaccine. An adult without evidence of immunity to varicella should receive 2 doses or a second dose if she has previously received 1 dose. Pregnant females who do not have evidence of immunity should receive the first dose after pregnancy. This first dose should be obtained before leaving the health care facility. The second dose should be obtained 4-8 weeks after the first dose.  Human papillomavirus (HPV) vaccine. Females aged 13-26 years who have not received the vaccine previously should obtain the 3-dose series. The vaccine is not recommended for use in pregnant females. However, pregnancy testing is not needed before receiving a dose. If a female is found to be pregnant after receiving a dose, no treatment is needed. In that case, the remaining doses should be delayed until after the pregnancy. Immunization is recommended for any person with an immunocompromised condition through the age of 26 years if she did not get any or all doses earlier. During the 3-dose series, the second dose should be obtained 4-8 weeks after the first dose. The third dose should be obtained 24 weeks after the first dose and 16 weeks after the second dose.  Zoster vaccine. One dose is recommended for adults aged 60 years or older unless certain conditions are  present.  Measles, mumps, and rubella (MMR) vaccine. Adults born before 1957 generally are considered immune to measles and mumps. Adults born in 1957 or later should have 1 or more doses of MMR vaccine unless there is a contraindication to the vaccine or there is laboratory evidence of immunity to   each of the three diseases. A routine second dose of MMR vaccine should be obtained at least 28 days after the first dose for students attending postsecondary schools, health care workers, or international travelers. People who received inactivated measles vaccine or an unknown type of measles vaccine during 1963-1967 should receive 2 doses of MMR vaccine. People who received inactivated mumps vaccine or an unknown type of mumps vaccine before 1979 and are at high risk for mumps infection should consider immunization with 2 doses of MMR vaccine. For females of childbearing age, rubella immunity should be determined. If there is no evidence of immunity, females who are not pregnant should be vaccinated. If there is no evidence of immunity, females who are pregnant should delay immunization until after pregnancy. Unvaccinated health care workers born before 1957 who lack laboratory evidence of measles, mumps, or rubella immunity or laboratory confirmation of disease should consider measles and mumps immunization with 2 doses of MMR vaccine or rubella immunization with 1 dose of MMR vaccine.  Pneumococcal 13-valent conjugate (PCV13) vaccine. When indicated, a person who is uncertain of her immunization history and has no record of immunization should receive the PCV13 vaccine. An adult aged 19 years or older who has certain medical conditions and has not been previously immunized should receive 1 dose of PCV13 vaccine. This PCV13 should be followed with a dose of pneumococcal polysaccharide (PPSV23) vaccine. The PPSV23 vaccine dose should be obtained at least 8 weeks after the dose of PCV13 vaccine. An adult aged 19  years or older who has certain medical conditions and previously received 1 or more doses of PPSV23 vaccine should receive 1 dose of PCV13. The PCV13 vaccine dose should be obtained 1 or more years after the last PPSV23 vaccine dose.  Pneumococcal polysaccharide (PPSV23) vaccine. When PCV13 is also indicated, PCV13 should be obtained first. All adults aged 65 years and older should be immunized. An adult younger than age 65 years who has certain medical conditions should be immunized. Any person who resides in a nursing home or long-term care facility should be immunized. An adult smoker should be immunized. People with an immunocompromised condition and certain other conditions should receive both PCV13 and PPSV23 vaccines. People with human immunodeficiency virus (HIV) infection should be immunized as soon as possible after diagnosis. Immunization during chemotherapy or radiation therapy should be avoided. Routine use of PPSV23 vaccine is not recommended for American Indians, [place] Natives, or people younger than 65 years unless there are medical conditions that require PPSV23 vaccine. When indicated, people who have unknown immunization and have no record of immunization should receive PPSV23 vaccine. One-time revaccination 5 years after the first dose of PPSV23 is recommended for people aged 19-64 years who have chronic kidney failure, nephrotic syndrome, asplenia, or immunocompromised conditions. People who received 1-2 doses of PPSV23 before age 65 years should receive another dose of PPSV23 vaccine at age 65 years or later if at least 5 years have passed since the previous dose. Doses of PPSV23 are not needed for people immunized with PPSV23 at or after age 65 years.  Meningococcal vaccine. Adults with asplenia or persistent complement component deficiencies should receive 2 doses of quadrivalent meningococcal conjugate (MenACWY-D) vaccine. The doses should be obtained at least 2 months apart.  Microbiologists working with certain meningococcal bacteria, military recruits, people at risk during an outbreak, and people who travel to or live in countries with a high rate of meningitis should be immunized. A first-year college student up through age   21 years who is living in a residence hall should receive a dose if she did not receive a dose on or after her 16th birthday. Adults who have certain high-risk conditions should receive one or more doses of vaccine.  Hepatitis A vaccine. Adults who wish to be protected from this disease, have certain high-risk conditions, work with hepatitis A-infected animals, work in hepatitis A research labs, or travel to or work in countries with a high rate of hepatitis A should be immunized. Adults who were previously unvaccinated and who anticipate close contact with an international adoptee during the first 60 days after arrival in the [place] from a country with a high rate of hepatitis A should be immunized.  Hepatitis B vaccine. Adults who wish to be protected from this disease, have certain high-risk conditions, may be exposed to blood or other infectious body fluids, are household contacts or sex partners of hepatitis B positive people, are clients or workers in certain care facilities, or travel to or work in countries with a high rate of hepatitis B should be immunized.  Haemophilus influenzae type b (Hib) vaccine. A previously unvaccinated person with asplenia or sickle cell disease or having a scheduled splenectomy should receive 1 dose of Hib vaccine. Regardless of previous immunization, a recipient of a hematopoietic stem cell transplant should receive a 3-dose series 6-12 months after her successful transplant. Hib vaccine is not recommended for adults with HIV infection. Preventive Services / Frequency Ages 64 to 68 years  Blood pressure check.** / Every 1 to 2 years.  Lipid and cholesterol check.** / Every 5 years beginning at age  22.  Clinical breast exam.** / Every 3 years for women in their 88s and 53s.  BRCA-related cancer risk assessment.** / For women who have family members with a BRCA-related cancer (breast, ovarian, tubal, or peritoneal cancers).  Pap test.** / Every 2 years from ages 90 through 51. Every 3 years starting at age 21 through age 56 or 3 with a history of 3 consecutive normal Pap tests.  HPV screening.** / Every 3 years from ages 24 through ages 1 to 46 with a history of 3 consecutive normal Pap tests.  Hepatitis C blood test.** / For any individual with known risks for hepatitis C.  Skin self-exam. / Monthly.  Influenza vaccine. / Every year.  Tetanus, diphtheria, and acellular pertussis (Tdap, Td) vaccine.** / Consult your health care provider. Pregnant women should receive 1 dose of Tdap vaccine during each pregnancy. 1 dose of Td every 10 years.  Varicella vaccine.** / Consult your health care provider. Pregnant females who do not have evidence of immunity should receive the first dose after pregnancy.  HPV vaccine. / 3 doses over 6 months, if 72 and younger. The vaccine is not recommended for use in pregnant females. However, pregnancy testing is not needed before receiving a dose.  Measles, mumps, rubella (MMR) vaccine.** / You need at least 1 dose of MMR if you were born in 1957 or later. You may also need a 2nd dose. For females of childbearing age, rubella immunity should be determined. If there is no evidence of immunity, females who are not pregnant should be vaccinated. If there is no evidence of immunity, females who are pregnant should delay immunization until after pregnancy.  Pneumococcal 13-valent conjugate (PCV13) vaccine.** / Consult your health care provider.  Pneumococcal polysaccharide (PPSV23) vaccine.** / 1 to 2 doses if you smoke cigarettes or if you have certain conditions.  Meningococcal vaccine.** /  1 dose if you are age 19 to 21 years and a first-year college  student living in a residence hall, or have one of several medical conditions, you need to get vaccinated against meningococcal disease. You may also need additional booster doses.  Hepatitis A vaccine.** / Consult your health care provider.  Hepatitis B vaccine.** / Consult your health care provider.  Haemophilus influenzae type b (Hib) vaccine.** / Consult your health care provider. Ages 40 to 64 years  Blood pressure check.** / Every 1 to 2 years.  Lipid and cholesterol check.** / Every 5 years beginning at age 20 years.  Lung cancer screening. / Every year if you are aged 55-80 years and have a 30-pack-year history of smoking and currently smoke or have quit within the past 15 years. Yearly screening is stopped once you have quit smoking for at least 15 years or develop a health problem that would prevent you from having lung cancer treatment.  Clinical breast exam.** / Every year after age 40 years.  BRCA-related cancer risk assessment.** / For women who have family members with a BRCA-related cancer (breast, ovarian, tubal, or peritoneal cancers).  Mammogram.** / Every year beginning at age 40 years and continuing for as long as you are in good health. Consult with your health care provider.  Pap test.** / Every 3 years starting at age 30 years through age 65 or 70 years with a history of 3 consecutive normal Pap tests.  HPV screening.** / Every 3 years from ages 30 years through ages 65 to 70 years with a history of 3 consecutive normal Pap tests.  Fecal occult blood test (FOBT) of stool. / Every year beginning at age 50 years and continuing until age 75 years. You may not need to do this test if you get a colonoscopy every 10 years.  Flexible sigmoidoscopy or colonoscopy.** / Every 5 years for a flexible sigmoidoscopy or every 10 years for a colonoscopy beginning at age 50 years and continuing until age 75 years.  Hepatitis C blood test.** / For all people born from 1945 through  1965 and any individual with known risks for hepatitis C.  Skin self-exam. / Monthly.  Influenza vaccine. / Every year.  Tetanus, diphtheria, and acellular pertussis (Tdap/Td) vaccine.** / Consult your health care provider. Pregnant women should receive 1 dose of Tdap vaccine during each pregnancy. 1 dose of Td every 10 years.  Varicella vaccine.** / Consult your health care provider. Pregnant females who do not have evidence of immunity should receive the first dose after pregnancy.  Zoster vaccine.** / 1 dose for adults aged 60 years or older.  Measles, mumps, rubella (MMR) vaccine.** / You need at least 1 dose of MMR if you were born in 1957 or later. You may also need a 2nd dose. For females of childbearing age, rubella immunity should be determined. If there is no evidence of immunity, females who are not pregnant should be vaccinated. If there is no evidence of immunity, females who are pregnant should delay immunization until after pregnancy.  Pneumococcal 13-valent conjugate (PCV13) vaccine.** / Consult your health care provider.  Pneumococcal polysaccharide (PPSV23) vaccine.** / 1 to 2 doses if you smoke cigarettes or if you have certain conditions.  Meningococcal vaccine.** / Consult your health care provider.  Hepatitis A vaccine.** / Consult your health care provider.  Hepatitis B vaccine.** / Consult your health care provider.  Haemophilus influenzae type b (Hib) vaccine.** / Consult your health care provider. Ages 65   years and over  Blood pressure check.** / Every 1 to 2 years.  Lipid and cholesterol check.** / Every 5 years beginning at age 22 years.  Lung cancer screening. / Every year if you are aged 73-80 years and have a 30-pack-year history of smoking and currently smoke or have quit within the past 15 years. Yearly screening is stopped once you have quit smoking for at least 15 years or develop a health problem that would prevent you from having lung cancer  treatment.  Clinical breast exam.** / Every year after age 4 years.  BRCA-related cancer risk assessment.** / For women who have family members with a BRCA-related cancer (breast, ovarian, tubal, or peritoneal cancers).  Mammogram.** / Every year beginning at age 40 years and continuing for as long as you are in good health. Consult with your health care provider.  Pap test.** / Every 3 years starting at age 9 years through age 34 or 91 years with 3 consecutive normal Pap tests. Testing can be stopped between 65 and 70 years with 3 consecutive normal Pap tests and no abnormal Pap or HPV tests in the past 10 years.  HPV screening.** / Every 3 years from ages 57 years through ages 64 or 45 years with a history of 3 consecutive normal Pap tests. Testing can be stopped between 65 and 70 years with 3 consecutive normal Pap tests and no abnormal Pap or HPV tests in the past 10 years.  Fecal occult blood test (FOBT) of stool. / Every year beginning at age 15 years and continuing until age 17 years. You may not need to do this test if you get a colonoscopy every 10 years.  Flexible sigmoidoscopy or colonoscopy.** / Every 5 years for a flexible sigmoidoscopy or every 10 years for a colonoscopy beginning at age 86 years and continuing until age 71 years.  Hepatitis C blood test.** / For all people born from [birth year] through 1966 and any individual with known risks for hepatitis C.  Osteoporosis screening.** / A one-time screening for women ages 83 years and over and women at risk for fractures or osteoporosis.  Skin self-exam. / Monthly.  Influenza vaccine. / Every year.  Tetanus, diphtheria, and acellular pertussis (Tdap/Td) vaccine.** / 1 dose of Td every 10 years.  Varicella vaccine.** / Consult your health care provider.  Zoster vaccine.** / 1 dose for adults aged 61 years or older.  Pneumococcal 13-valent conjugate (PCV13) vaccine.** / Consult your health care provider.  Pneumococcal  polysaccharide (PPSV23) vaccine.** / 1 dose for all adults aged 28 years and older.  Meningococcal vaccine.** / Consult your health care provider.  Hepatitis A vaccine.** / Consult your health care provider.  Hepatitis B vaccine.** / Consult your health care provider.  Haemophilus influenzae type b (Hib) vaccine.** / Consult your health care provider. ** Family history and personal history of risk and conditions may change your health care provider's recommendations. Document Released: 03/06/2001 Document Revised: 05/25/2013 Document Reviewed: 06/05/2010 [facility] Patient Information 2015 [place], [place]. This information is not intended to replace advice given to you by your health care provider. Make sure you discuss any questions you have with your health care provider.

## 2013-10-23 NOTE — Progress Notes (Signed)
Pre visit review using our clinic review tool, if applicable. No additional management support is needed unless otherwise documented below in the visit note. 

## 2013-10-23 NOTE — Progress Notes (Signed)
Subjective:    Penny Lamb is a 68 y.o. female who presents for Medicare Annual/Subsequent preventive examination.  Preventive Screening-Counseling & Management  Tobacco History  Smoking status  . Smoker, Current Status Unknown -- 0.50 packs/day for 52 years  Smokeless tobacco  . Not on file     Problems Prior to Visit 1. none  Current Problems (verified) Patient Active Problem List   Diagnosis Date Noted  . Genital herpes 12/31/2012  . Rib pain on right side 01/09/2012  . VAGINITIS, ATROPHIC 02/28/2010  . CYSTITIS 02/21/2010  . HEMATURIA UNSPECIFIED 02/21/2010  . FOOT PAIN, LEFT 10/21/2009  . DEPRESSION 09/14/2009  . ALLERGIC RHINITIS 09/14/2009  . ASTHMA 09/14/2009  . ARTIFICIAL MENOPAUSE 09/14/2009  . OSTEOARTHRITIS 09/14/2009  . HEADACHE 09/14/2009  . SKIN CANCER, HX OF 09/14/2009  . NEPHROLITHIASIS, HX OF 09/14/2009    Medications Prior to Visit Current Outpatient Prescriptions on File Prior to Visit  Medication Sig Dispense Refill  . Calcium Carbonate-Vitamin D (CALCIUM-VITAMIN D) 600-200 MG-UNIT CAPS Take by mouth 2 (two) times daily.        Marland Kitchen dicyclomine (BENTYL) 10 MG capsule 1-2 tabs po tid prn  90 capsule  0  . Multiple Vitamins-Minerals (HAIR/SKIN/NAILS) TABS Take 1 tablet by mouth daily.      . vitamin E 1000 UNIT capsule Take 1,000 Units by mouth daily.       No current facility-administered medications on file prior to visit.    Current Medications (verified) Current Outpatient Prescriptions  Medication Sig Dispense Refill  . Calcium Carbonate-Vitamin D (CALCIUM-VITAMIN D) 600-200 MG-UNIT CAPS Take by mouth 2 (two) times daily.        Marland Kitchen dicyclomine (BENTYL) 10 MG capsule 1-2 tabs po tid prn  90 capsule  0  . Multiple Vitamins-Minerals (HAIR/SKIN/NAILS) TABS Take 1 tablet by mouth daily.      . vitamin E 1000 UNIT capsule Take 1,000 Units by mouth daily.       No current facility-administered medications for this visit.     Allergies  (verified) Bactrim; Codeine; and Hydrocodone   PAST HISTORY  Family History Family History  Problem Relation Age of Onset  . Cancer Other     SKIN  . Asthma Other   . Arthritis Other   . Hypertension Other   . Dementia Mother   . Cancer Mother 74    stomach cancer  . Dementia Brother     Social History History  Substance Use Topics  . Smoking status: Smoker, Current Status Unknown -- 0.50 packs/day for 52 years  . Smokeless tobacco: Not on file  . Alcohol Use: No     Are there smokers in your home (other than you)? No  Risk Factors Current exercise habits: walk 3x a day  Dietary issues discussed: na   Cardiac risk factors: advanced age (older than 42 for men, 72 for women) and smoking/ tobacco exposure.  Depression Screen (Note: if answer to either of the following is "Yes", a more complete depression screening is indicated)   Over the past two weeks, have you felt down, depressed or hopeless? No  Over the past two weeks, have you felt little interest or pleasure in doing things? No  Have you lost interest or pleasure in daily life? No  Do you often feel hopeless? No  Do you cry easily over simple problems? No  Activities of Daily Living In your present state of health, do you have any difficulty performing the following activities?:  Driving? No  Managing money?  No Feeding yourself? No Getting from bed to chair? No Climbing a flight of stairs? No Preparing food and eating?: No Bathing or showering? No Getting dressed: No Getting to the toilet? No Using the toilet:No Moving around from place to place: No In the past year have you fallen or had a near fall?:No   Are you sexually active?  No  Do you have more than one partner?  No  Hearing Difficulties: No Do you often ask people to speak up or repeat themselves? No Do you experience ringing or noises in your ears? No Do you have difficulty understanding soft or whispered voices? No   Do you feel that  you have a problem with memory? No  Do you often misplace items? No  Do you feel safe at home?  Yes  Cognitive Testing  Alert? Yes  Normal Appearance?Yes  Oriented to person? Yes  Place? Yes   Time? Yes  Recall of three objects?  Yes  Can perform simple calculations? Yes  Displays appropriate judgment?Yes  Can read the correct time from a watch face?Yes   Advanced Directives have been discussed with the patient? Yes  List the Names of Other Physician/Practitioners you currently use: 1. opth--[facility] 2. Dentist-  DR Mamie Nick-- [facility] 3.  Derm-- [facility]   Indicate any recent Medical Services you may have received from other than [facility] providers in the past year (date may be approximate).  Immunization History  Administered Date(s) Administered  . Hepatitis B 10/26/2009  . Influenza Split 11/15/2010, 10/25/2011  . Influenza Whole 10/21/2009  . Influenza,inj,Quad PF,36+ Mos 10/16/2012, 10/23/2013  . Pneumococcal Polysaccharide-23 10/08/2007, 06/11/2012  . Zoster 10/21/2009    Screening Tests Health Maintenance  Topic Date Due  . Mammogram  11/19/2013  . Influenza Vaccine  08/23/2014  . Tetanus/tdap  11/15/2015  . Colonoscopy  11/14/2017  . Pneumococcal Polysaccharide Vaccine Age 65 And Over  Completed  . Zostavax  Completed    All answers were reviewed with the patient and necessary referrals were made:  Garnet Koyanagi, DO   10/23/2013   History reviewed:  She  has a past medical history of UTI (urinary tract infection); IBS (irritable bowel syndrome); Asthma; Arthritis; Cancer; Depression; Allergy; Headache(784.0); and Nephrolithiasis. She  does not have any pertinent problems on file. She  has past surgical history that includes Abdominal hysterectomy; Breast surgery; and Eye surgery (Left, 2013). Her family history includes Arthritis in her other; Asthma in her other; Cancer in her other; Cancer (age of onset: 17) in her mother; Dementia in her brother and mother;  Hypertension in her other. She  reports that she has been smoking.  She does not have any smokeless tobacco history on file. She reports that she does not drink alcohol or use illicit drugs. She has a current medication list which includes the following prescription(s): calcium-vitamin d, dicyclomine, hair/skin/nails, and vitamin e. Current Outpatient Prescriptions on File Prior to Visit  Medication Sig Dispense Refill  . Calcium Carbonate-Vitamin D (CALCIUM-VITAMIN D) 600-200 MG-UNIT CAPS Take by mouth 2 (two) times daily.        Marland Kitchen dicyclomine (BENTYL) 10 MG capsule 1-2 tabs po tid prn  90 capsule  0  . Multiple Vitamins-Minerals (HAIR/SKIN/NAILS) TABS Take 1 tablet by mouth daily.      . vitamin E 1000 UNIT capsule Take 1,000 Units by mouth daily.       No current facility-administered medications on file prior to visit.   She is allergic to  bactrim; codeine; and hydrocodone.  Review of Systems  Review of Systems  Constitutional: Negative for activity change, appetite change and fatigue.  HENT: Negative for hearing loss, congestion, tinnitus and ear discharge.   Eyes: Negative for visual disturbance (see optho q1y -- vision corrected to 20/20 with glasses).  Respiratory: Negative for cough, chest tightness and shortness of breath.   Cardiovascular: Negative for chest pain, palpitations and leg swelling.  Gastrointestinal: Negative for abdominal pain, diarrhea, constipation and abdominal distention.  Genitourinary: Negative for urgency, frequency, decreased urine volume and difficulty urinating.  Musculoskeletal: Negative for back pain, arthralgias and gait problem.  Skin: Negative for color change, pallor and rash.  Neurological: Negative for dizziness, light-headedness, numbness and headaches.  Hematological: Negative for adenopathy. Does not bruise/bleed easily.  Psychiatric/Behavioral: Negative for suicidal ideas, confusion, sleep disturbance, self-injury, dysphoric mood, decreased  concentration and agitation.  Pt is able to read and write and can do all ADLs No risk for falling No abuse/ violence in home      Objective:     Vision by Snellen chart: opth  Body mass index is 26.26 kg/(m^2). BP 122/72  Pulse 73  Temp(Src) 98 F (36.7 C) (Oral)  Ht 5\' 1"  (1.549 m)  Wt 138 lb 14.2 oz (63 kg)  BMI 26.26 kg/m2  SpO2 97%  BP 122/72  Pulse 73  Temp(Src) 98 F (36.7 C) (Oral)  Ht 5\' 1"  (1.549 m)  Wt 138 lb 14.2 oz (63 kg)  BMI 26.26 kg/m2  SpO2 97% General appearance: alert, cooperative, appears stated age and no distress Head: Normocephalic, without obvious abnormality, atraumatic Eyes: conjunctivae/corneas clear. PERRL, EOM's intact. Fundi benign. Ears: normal TM's and external ear canals both ears Nose: Nares normal. Septum midline. Mucosa normal. No drainage or sinus tenderness. Throat: lips, mucosa, and tongue normal; teeth and gums normal Neck: no adenopathy, supple, symmetrical, trachea midline and thyroid not enlarged, symmetric, no tenderness/mass/nodules Back: symmetric, no curvature. ROM normal. No CVA tenderness. Lungs: clear to auscultation bilaterally Breasts: normal appearance, no masses or tenderness Heart: regular rate and rhythm, S1, S2 normal, no murmur, click, rub or gallop Abdomen: soft, non-tender; bowel sounds normal; no masses,  no organomegaly Pelvic: not indicated; status post hysterectomy, negative ROS Extremities: extremities normal, atraumatic, no cyanosis or edema Pulses: 2+ and symmetric Skin: Skin color, texture, turgor normal. No rashes or lesions Lymph nodes: Cervical, supraclavicular, and axillary nodes normal. Neurologic: Alert and oriented X 3, normal strength and tone. Normal symmetric reflexes. Normal coordination and gait Psych- no depression, no anxiety      Assessment:     cpe      Plan:     During the course of the visit the patient was educated and counseled about appropriate screening and preventive  services including:    Pneumococcal vaccine   Influenza vaccine  Screening mammography  Bone densitometry screening  Colorectal cancer screening  Diabetes screening  Glaucoma screening  Advanced directives: has an advanced directive - a copy HAS NOT been provided.  Diet review for nutrition referral? Yes ____  Not Indicated _x___   Patient Instructions (the written plan) was given to the patient.  Medicare Attestation I have personally reviewed: The patient's medical and social history Their use of alcohol, tobacco or illicit drugs Their current medications and supplements The patient's functional ability including ADLs,fall risks, home safety risks, cognitive, and hearing and visual impairment Diet and physical activities Evidence for depression or mood disorders  The patient's weight, height, BMI, and visual  acuity have been recorded in the chart.  I have made referrals, counseling, and provided education to the patient based on review of the above and I have provided the patient with a written personalized care plan for preventive services.    1. Need for immunization against influenza  - Flu Vaccine QUAD 36+ mos IM (Fluarix)  2. Estrogen deficiency  - DG Bone Density; Future  3. Screening breast examination  - MM DIGITAL SCREENING BILATERAL; Future  4. Medicare annual wellness visit, subsequent   5. Ischemic heart disease screen  - Basic metabolic panel - CBC with Differential - Hepatic function panel - Lipid panel - POCT urinalysis dipstick  6. Need for vaccination with 13-polyvalent pneumococcal conjugate vaccine  - Pneumococcal conjugate vaccine 13-valent IM   Garnet Koyanagi, DO   10/23/2013

## 2013-10-26 ENCOUNTER — Telehealth: Payer: Self-pay | Admitting: Family Medicine

## 2013-10-26 NOTE — Telephone Encounter (Signed)
emmi emailed °

## 2013-10-27 ENCOUNTER — Other Ambulatory Visit: Payer: Self-pay | Admitting: Family Medicine

## 2013-10-27 DIAGNOSIS — N63 Unspecified lump in unspecified breast: Secondary | ICD-10-CM

## 2013-12-02 ENCOUNTER — Ambulatory Visit
Admission: RE | Admit: 2013-12-02 | Discharge: 2013-12-02 | Disposition: A | Discharge status: Home / Self Care | Payer: Medicare Other | Source: Ambulatory Visit | Attending: Family Medicine | Admitting: Family Medicine

## 2013-12-02 DIAGNOSIS — E2839 Other primary ovarian failure: Secondary | ICD-10-CM

## 2013-12-02 DIAGNOSIS — N63 Unspecified lump in unspecified breast: Secondary | ICD-10-CM

## 2014-05-02 ENCOUNTER — Encounter (HOSPITAL_COMMUNITY): Payer: Self-pay | Admitting: *Deleted

## 2014-05-02 ENCOUNTER — Emergency Department (HOSPITAL_COMMUNITY)
Admission: EM | Admit: 2014-05-02 | Discharge: 2014-05-02 | Disposition: A | Discharge status: Home / Self Care | Payer: Medicare Other | Attending: Emergency Medicine | Admitting: Emergency Medicine

## 2014-05-02 ENCOUNTER — Emergency Department (HOSPITAL_COMMUNITY): Payer: Medicare Other

## 2014-05-02 DIAGNOSIS — Z87442 Personal history of urinary calculi: Secondary | ICD-10-CM | POA: Insufficient documentation

## 2014-05-02 DIAGNOSIS — J45909 Unspecified asthma, uncomplicated: Secondary | ICD-10-CM | POA: Diagnosis not present

## 2014-05-02 DIAGNOSIS — Z8659 Personal history of other mental and behavioral disorders: Secondary | ICD-10-CM | POA: Diagnosis not present

## 2014-05-02 DIAGNOSIS — Z85828 Personal history of other malignant neoplasm of skin: Secondary | ICD-10-CM | POA: Insufficient documentation

## 2014-05-02 DIAGNOSIS — Z87440 Personal history of urinary (tract) infections: Secondary | ICD-10-CM | POA: Diagnosis not present

## 2014-05-02 DIAGNOSIS — K589 Irritable bowel syndrome without diarrhea: Secondary | ICD-10-CM | POA: Insufficient documentation

## 2014-05-02 DIAGNOSIS — M25552 Pain in left hip: Secondary | ICD-10-CM | POA: Diagnosis not present

## 2014-05-02 DIAGNOSIS — M545 Low back pain: Secondary | ICD-10-CM | POA: Diagnosis present

## 2014-05-02 DIAGNOSIS — Z720 Tobacco use: Secondary | ICD-10-CM | POA: Insufficient documentation

## 2014-05-02 DIAGNOSIS — M5442 Lumbago with sciatica, left side: Secondary | ICD-10-CM | POA: Insufficient documentation

## 2014-05-02 LAB — CBC WITH DIFFERENTIAL/PLATELET
BASOS ABS: 0.1 10*3/uL (ref 0.0–0.1)
Basophils Relative: 1 % (ref 0–1)
EOS PCT: 3 % (ref 0–5)
Eosinophils Absolute: 0.2 10*3/uL (ref 0.0–0.7)
HCT: 46.3 % — ABNORMAL HIGH (ref 36.0–46.0)
Hemoglobin: 15.4 g/dL — ABNORMAL HIGH (ref 12.0–15.0)
LYMPHS PCT: 37 % (ref 12–46)
Lymphs Abs: 2.9 10*3/uL (ref 0.7–4.0)
MCH: 31.6 pg (ref 26.0–34.0)
MCHC: 33.3 g/dL (ref 30.0–36.0)
MCV: 95.1 fL (ref 78.0–100.0)
MONOS PCT: 6 % (ref 3–12)
Monocytes Absolute: 0.5 10*3/uL (ref 0.1–1.0)
NEUTROS PCT: 53 % (ref 43–77)
Neutro Abs: 4 10*3/uL (ref 1.7–7.7)
PLATELETS: 339 10*3/uL (ref 150–400)
RBC: 4.87 MIL/uL (ref 3.87–5.11)
RDW: 13.7 % (ref 11.5–15.5)
WBC: 7.7 10*3/uL (ref 4.0–10.5)

## 2014-05-02 LAB — URINALYSIS, ROUTINE W REFLEX MICROSCOPIC
Bilirubin Urine: NEGATIVE
Glucose, UA: NEGATIVE mg/dL
KETONES UR: NEGATIVE mg/dL
Nitrite: NEGATIVE
PH: 5 (ref 5.0–8.0)
Protein, ur: NEGATIVE mg/dL
SPECIFIC GRAVITY, URINE: 1.017 (ref 1.005–1.030)
Urobilinogen, UA: 0.2 mg/dL (ref 0.0–1.0)

## 2014-05-02 LAB — COMPREHENSIVE METABOLIC PANEL
ALK PHOS: 75 U/L (ref 39–117)
ALT: 16 U/L (ref 0–35)
AST: 22 U/L (ref 0–37)
Albumin: 4.2 g/dL (ref 3.5–5.2)
Anion gap: 10 (ref 5–15)
BUN: 12 mg/dL (ref 6–23)
CO2: 26 mmol/L (ref 19–32)
Calcium: 9.6 mg/dL (ref 8.4–10.5)
Chloride: 103 mmol/L (ref 96–112)
Creatinine, Ser: 0.77 mg/dL (ref 0.50–1.10)
GFR calc Af Amer: >90 mL/min (ref >90)
GFR, EST NON AFRICAN AMERICAN: 84 mL/min — AB (ref >90)
Glucose, Bld: 95 mg/dL (ref 70–99)
Potassium: 3.9 mmol/L (ref 3.5–5.1)
SODIUM: 139 mmol/L (ref 135–145)
Total Bilirubin: 0.7 mg/dL (ref 0.3–1.2)
Total Protein: 7.3 g/dL (ref 6.0–8.3)

## 2014-05-02 LAB — URINE MICROSCOPIC-ADD ON

## 2014-05-02 MED ORDER — ACETAMINOPHEN 325 MG PO TABS
650.0000 mg | ORAL_TABLET | Freq: Once | ORAL | Status: AC
  Administered 2014-05-02: 650 mg via ORAL
  Filled 2014-05-02: qty 2

## 2014-05-02 MED ORDER — TRAMADOL HCL 50 MG PO TABS: 25.0000 mg | ORAL_TABLET | ORAL | Status: DC

## 2014-05-02 MED ORDER — MORPHINE SULFATE 4 MG/ML IJ SOLN
4.0000 mg | Freq: Once | INTRAMUSCULAR | Status: AC
  Administered 2014-05-02: 4 mg via INTRAMUSCULAR
  Filled 2014-05-02: qty 1

## 2014-05-02 NOTE — ED Notes (Signed)
No known injury

## 2014-05-02 NOTE — ED Notes (Signed)
Pt refused to have discharge vital signs taken

## 2014-05-02 NOTE — ED Provider Notes (Signed)
CSN: [id number]     Arrival date & time 05/02/14  1423 History   First MD Initiated Contact with Patient 05/02/14 1614     Chief Complaint  Patient presents with  . Back Pain     (Consider location/radiation/quality/duration/timing/severity/associated sxs/prior Treatment) Patient is a 69 y.o. female presenting with back pain. The history is provided by the patient.  Back Pain Location:  Lumbar spine Quality:  Aching Radiates to:  L foot Pain severity:  Moderate Pain is:  Same all the time Onset quality:  Sudden Duration:  2 weeks Timing:  Constant Progression:  Worsening Chronicity:  Recurrent Context comment:  Bending over Relieved by:  Nothing Worsened by:  Bending Ineffective treatments: tylenol. Associated symptoms: no abdominal pain, no chest pain, no dysuria, no fever and no headaches     Past Medical History  Diagnosis Date  . UTI (urinary tract infection)   . IBS (irritable bowel syndrome)   . Asthma   . Arthritis   . Cancer     SKIN  . Depression   . Allergy     RHINITIS  . Headache(784.0)   . Nephrolithiasis    Past Surgical History  Procedure Laterality Date  . Abdominal hysterectomy    . Breast surgery      BIOPSY.....CYST ASPIRATED  . Eye surgery Left 2013    lens implant, catarct L   Family History  Problem Relation Age of Onset  . Cancer Other     SKIN  . Asthma Other   . Arthritis Other   . Hypertension Other   . Dementia Mother   . Cancer Mother 36    stomach cancer  . Dementia Brother    History  Substance Use Topics  . Smoking status: Smoker, Current Status Unknown -- 0.50 packs/day for 52 years  . Smokeless tobacco: Not on file  . Alcohol Use: No   OB History    No data available     Review of Systems  Constitutional: Negative for fever and fatigue.  HENT: Negative for congestion and drooling.   Eyes: Negative for pain.  Respiratory: Negative for cough and shortness of breath.   Cardiovascular: Negative for chest pain.   Gastrointestinal: Negative for nausea, vomiting, abdominal pain and diarrhea.  Genitourinary: Negative for dysuria and hematuria.  Musculoskeletal: Positive for back pain. Negative for gait problem and neck pain.  Skin: Negative for color change.  Neurological: Negative for dizziness and headaches.  Hematological: Negative for adenopathy.  Psychiatric/Behavioral: Negative for behavioral problems.  All other systems reviewed and are negative.     Allergies  Bactrim; Codeine; Hydrocodone; and Ibuprofen  Home Medications   Prior to Admission medications   Medication Sig Start Date End Date Taking? Authorizing Provider  Calcium Carbonate-Vitamin D (CALCIUM-VITAMIN D) 600-200 MG-UNIT CAPS Take by mouth 2 (two) times daily.     Yes Historical Provider, MD  Multiple Vitamins-Minerals (HAIR/SKIN/NAILS) TABS Take 1 tablet by mouth daily.   Yes Historical Provider, MD  vitamin E 1000 UNIT capsule Take 1,000 Units by mouth daily.   Yes Historical Provider, MD  dicyclomine (BENTYL) 10 MG capsule 1-2 tabs po tid prn 11/15/10 10/23/13  Yvonne R Lowne, DO   BP 109/58 mmHg  Pulse 75  Temp(Src) 98.4 F (36.9 C)  Resp 20  SpO2 97% Physical Exam  Constitutional: She is oriented to person, place, and time. She appears well-developed and well-nourished.  HENT:  Head: Normocephalic.  Mouth/Throat: Oropharynx is clear and moist. No oropharyngeal exudate.  Eyes: Conjunctivae and EOM are normal. Pupils are equal, round, and reactive to light.  Neck: Normal range of motion. Neck supple.  Cardiovascular: Normal rate, regular rhythm, normal heart sounds and intact distal pulses.  Exam reveals no gallop and no friction rub.   No murmur heard. Pulmonary/Chest: Effort normal and breath sounds normal. No respiratory distress. She has no wheezes.  Abdominal: Soft. Bowel sounds are normal. There is no tenderness. There is no rebound and no guarding.  Genitourinary:  Normal sensation in saddle area.   Musculoskeletal: Normal range of motion. She exhibits no edema or tenderness.  Normal strength and sensation in bilateral lower extremities.  2+ distal pulses in bilateral lower extremities.  Normal capillary refill in the distal extremities.  The patient is ambulatory with a normal gait.  Reproduction of symptoms with bending of the torso and lifting the left leg.  Focal tenderness to palpation of the left upper buttock and left lateral hip. No vertebral tenderness.    Neurological: She is alert and oriented to person, place, and time.  Skin: Skin is warm and dry.  Psychiatric: She has a normal mood and affect. Her behavior is normal.  Nursing note and vitals reviewed.   ED Course  Procedures (including critical care time) Labs Review Labs Reviewed  COMPREHENSIVE METABOLIC PANEL - Abnormal; Notable for the following:    GFR calc non Af Amer 84 (*)    All other components within normal limits  CBC WITH DIFFERENTIAL/PLATELET - Abnormal; Notable for the following:    Hemoglobin 15.4 (*)    HCT 46.3 (*)    All other components within normal limits  URINALYSIS, ROUTINE W REFLEX MICROSCOPIC - Abnormal; Notable for the following:    Hgb urine dipstick TRACE (*)    Leukocytes, UA MODERATE (*)    All other components within normal limits  URINE MICROSCOPIC-ADD ON - Abnormal; Notable for the following:    Bacteria, UA FEW (*)    All other components within normal limits  URINE CULTURE    Imaging Review Dg Lumbar Spine Complete  05/02/2014   CLINICAL DATA:  Lumbar spine pain. Injury 3 weeks prior picking up a box. Pain primarily on the left side radiating to the foot.  EXAM: LUMBAR SPINE - COMPLETE 4+ VIEW  COMPARISON:  None.  FINDINGS: The alignment is maintained. Vertebral body heights are normal. No fracture. There is disc space narrowing at L4-L5 with associated minimal anterolisthesis. Disc space narrowing at L2-L3 with endplate spurring. There is multilevel facet arthropathy  throughout the lumbar spine. The bones appear under mineralized. Sacroiliac joints are congruent.  IMPRESSION: 1. No acute bony abnormality. 2. Multilevel degenerative disc disease and facet arthropathy, most significant at L2-L3 and L4-L5. Mild anterolisthesis of L4 on L5 appears degenerative in etiology.   Electronically Signed   By: Jeb Levering M.D.   On: 05/02/2014 18:18   Dg Hip Unilat With Pelvis 2-3 Views Left  05/02/2014   CLINICAL DATA:  LEFT hip pain. Initial encounter. Radiations of the LEFT foot.  EXAM: LEFT HIP (WITH PELVIS) 2-3 VIEWS  COMPARISON:  None.  FINDINGS: Pelvic rings intact. Hip joint spaces are symmetric. Negative for fracture.  IMPRESSION: Negative.   Electronically Signed   By: Dereck Ligas M.D.   On: 05/02/2014 18:17     EKG Interpretation None      MDM   Final diagnoses:  Left hip pain  Left-sided low back pain with left-sided sciatica    5:09 PM 69 y.o.  female w hx of IBS who presents with low back pain began about 2 weeks ago after bending over to pick something up. She notes it has continued to worsen since that time. She describes an aching, throbbing pain in her left buttock radiating to her left hip and down the back of her left leg to her heel. She denies any weakness, numbness, bowel or bladder incontinence, diabetes, or other back pain red flags. She does have a history of skin cancer. She states that she had a viral syndrome which lasted about 24 hours 2 weeks and believes she had a fever during that time. She is not had a fever since then. Her pain is reproducible with palpation and movement of the torso. Likely musculoskeletal but will also get screening film of the left hip given pain in this area. Do not think this is serious occult pathology such as epidural abscess/hematoma or AAA. The patient has had similar back issues in the past although this episode seems to be lasting longer and feels slightly different.  7:35 PM: I interpreted/reviewed  the labs and/or imaging which were non-contributory.  Likely sciatica. Pt does not tolerate hydrocodone or oxycodone well. Offered tramadol but she declined. We'll recommend Tylenol on a schedule. UA with a few bacteria and leuks but no GU symptoms. We'll send a urine culture. I have discussed the diagnosis/risks/treatment options with the patient and believe the pt to be eligible for discharge home to follow-up with her pcp tomorrow. We also discussed returning to the ED immediately if new or worsening sx occur. We discussed the sx which are most concerning (e.g., fever, weakness, numbness, bp red flags discussed) that necessitate immediate return. Medications administered to the patient during their visit and any new prescriptions provided to the patient are listed below.  Medications given during this visit Medications  acetaminophen (TYLENOL) tablet 650 mg (650 mg Oral Given 05/02/14 1823)  morphine 4 MG/ML injection 4 mg (4 mg Intramuscular Given 05/02/14 1821)    New Prescriptions   No medications on file       Pamella Pert, MD 05/02/14 1946

## 2014-05-02 NOTE — ED Notes (Signed)
MD at bedside. 

## 2014-05-02 NOTE — ED Notes (Signed)
Pt reports hx of back pain, this pain is different. Reports severe left side back pain x 3 days, radiates around to left hip. Pt is ambulatory at triage. Denies incontinence or urinary symptoms.

## 2014-05-02 NOTE — ED Notes (Signed)
Pt unsure about taking percocet while in triage due to hx of allergic reaction to pain meds in past. Unable to take ibuprofen due to GI upset.

## 2014-05-03 ENCOUNTER — Other Ambulatory Visit: Payer: Self-pay | Admitting: Family Medicine

## 2014-05-03 ENCOUNTER — Telehealth: Payer: Self-pay | Admitting: Family Medicine

## 2014-05-03 DIAGNOSIS — M5442 Lumbago with sciatica, left side: Secondary | ICD-10-CM

## 2014-05-03 NOTE — Telephone Encounter (Signed)
Caller name: [place] Relation to pt: self Call back number: [phone] Pharmacy:  Reason for call:   Patient was seen in [facility] ED  over the weekend. Was told that she needed an MRI. Patient is requesting that MRI be ordered.

## 2014-05-03 NOTE — Telephone Encounter (Signed)
Order in.

## 2014-05-03 NOTE — Telephone Encounter (Signed)
To MD to review and advise      KP 

## 2014-05-04 ENCOUNTER — Emergency Department (HOSPITAL_BASED_OUTPATIENT_CLINIC_OR_DEPARTMENT_OTHER)
Admission: EM | Admit: 2014-05-04 | Discharge: 2014-05-04 | Disposition: A | Discharge status: Home / Self Care | Payer: Medicare Other | Attending: Emergency Medicine | Admitting: Emergency Medicine

## 2014-05-04 ENCOUNTER — Ambulatory Visit
Admission: RE | Admit: 2014-05-04 | Discharge: 2014-05-04 | Disposition: A | Discharge status: Home / Self Care | Payer: Medicare Other | Source: Ambulatory Visit | Attending: Family Medicine | Admitting: Family Medicine

## 2014-05-04 ENCOUNTER — Encounter (HOSPITAL_BASED_OUTPATIENT_CLINIC_OR_DEPARTMENT_OTHER): Payer: Self-pay | Admitting: *Deleted

## 2014-05-04 DIAGNOSIS — K589 Irritable bowel syndrome without diarrhea: Secondary | ICD-10-CM | POA: Diagnosis not present

## 2014-05-04 DIAGNOSIS — Z87440 Personal history of urinary (tract) infections: Secondary | ICD-10-CM | POA: Insufficient documentation

## 2014-05-04 DIAGNOSIS — Z85828 Personal history of other malignant neoplasm of skin: Secondary | ICD-10-CM | POA: Insufficient documentation

## 2014-05-04 DIAGNOSIS — J45909 Unspecified asthma, uncomplicated: Secondary | ICD-10-CM | POA: Insufficient documentation

## 2014-05-04 DIAGNOSIS — M5442 Lumbago with sciatica, left side: Secondary | ICD-10-CM

## 2014-05-04 DIAGNOSIS — Z8659 Personal history of other mental and behavioral disorders: Secondary | ICD-10-CM | POA: Diagnosis not present

## 2014-05-04 DIAGNOSIS — Z720 Tobacco use: Secondary | ICD-10-CM | POA: Insufficient documentation

## 2014-05-04 DIAGNOSIS — M5416 Radiculopathy, lumbar region: Secondary | ICD-10-CM | POA: Insufficient documentation

## 2014-05-04 DIAGNOSIS — M1990 Unspecified osteoarthritis, unspecified site: Secondary | ICD-10-CM | POA: Insufficient documentation

## 2014-05-04 DIAGNOSIS — Z79899 Other long term (current) drug therapy: Secondary | ICD-10-CM | POA: Diagnosis not present

## 2014-05-04 DIAGNOSIS — Z87442 Personal history of urinary calculi: Secondary | ICD-10-CM | POA: Diagnosis not present

## 2014-05-04 DIAGNOSIS — M545 Low back pain: Secondary | ICD-10-CM | POA: Diagnosis present

## 2014-05-04 LAB — URINE CULTURE
Colony Count: NO GROWTH
Culture: NO GROWTH

## 2014-05-04 MED ORDER — ONDANSETRON 4 MG PO TBDP: ORAL_TABLET | ORAL | Status: DC

## 2014-05-04 MED ORDER — OXYCODONE-ACETAMINOPHEN 5-325 MG PO TABS
1.0000 | ORAL_TABLET | Freq: Once | ORAL | Status: AC
  Administered 2014-05-04: 1 via ORAL
  Filled 2014-05-04: qty 1

## 2014-05-04 MED ORDER — ONDANSETRON 4 MG PO TBDP
4.0000 mg | ORAL_TABLET | Freq: Once | ORAL | Status: AC
  Administered 2014-05-04: 4 mg via ORAL
  Filled 2014-05-04: qty 1

## 2014-05-04 MED ORDER — OXYCODONE-ACETAMINOPHEN 5-325 MG PO TABS: 1.0000 | ORAL_TABLET | Freq: Four times a day (QID) | ORAL | Status: DC | PRN

## 2014-05-04 NOTE — ED Notes (Signed)
Pt. Reports the lower back pain started approx. 3 weeks ago.  Pt. Was seen in [facility] ED yesterday with x rays taken.   Pt. Was given a morphine injection at hospital.  Pt. Reports tonight the pain got severe and she needed to be seen and needed pain relief.  Pt. Reports she has been able to urinate and poop at home.  Pt. Did take flexeril at home tonight with no relief and tylenol with no relief.

## 2014-05-04 NOTE — Discharge Instructions (Signed)

## 2014-05-04 NOTE — ED Provider Notes (Signed)
CSN: [id number]     Arrival date & time 05/04/14  0015 History  This chart was scribed for Julianne Rice, MD by Edison Simon, ED Scribe. This patient was seen in room MH10/MH10 and the patient's care was started at 12:31 AM.    Chief Complaint  Patient presents with  . Back Pain   HPI  HPI Comments: Penny Lamb is a 69 y.o. female who presents to the Emergency Department complaining of left-sided back pain radiating to buttock, down to heel, and to left hip area with onset 3 weeks ago. She states she was bending when pain began. She reports history of bulging disc and states she has had pain intermittently for years, but "nothing like this." She was seen at [facility] for the same 2 days ago, but pain worsened tonight. She also reports some numbness on left side. She denies any pain or numbness on right side. She has been using Tylenol and Flexeril; she last used 1/2 Flexeril tablet at 1700 and another 1/2 tablet at 1900, but denies improvement either time. She received Fentanyl en route by EMS and states pain is improved now. She is scheduled for MRI later today, ordered by her PCP Garnet Koyanagi, DO, and states last MRI was 25-30 years ago. She states she has been urinating and moving bowels normally, last bowel movement was this evening. She denies nausea, vomiting, cough, or sore throat.   Past Medical History  Diagnosis Date  . UTI (urinary tract infection)   . IBS (irritable bowel syndrome)   . Asthma   . Arthritis   . Cancer     SKIN  . Depression   . Allergy     RHINITIS  . Headache(784.0)   . Nephrolithiasis    Past Surgical History  Procedure Laterality Date  . Abdominal hysterectomy    . Breast surgery      BIOPSY.....CYST ASPIRATED  . Eye surgery Left 2013    lens implant, catarct L   Family History  Problem Relation Age of Onset  . Cancer Other     SKIN  . Asthma Other   . Arthritis Other   . Hypertension Other   . Dementia Mother   . Cancer Mother 32     stomach cancer  . Dementia Brother    History  Substance Use Topics  . Smoking status: Smoker, Current Status Unknown -- 0.50 packs/day for 52 years  . Smokeless tobacco: Not on file  . Alcohol Use: No   OB History    No data available     Review of Systems  Cardiovascular: Negative for leg swelling.  Gastrointestinal: Negative for nausea, vomiting and abdominal pain.  Musculoskeletal: Positive for back pain. Negative for neck pain and neck stiffness.  Skin: Negative for rash.  Neurological: Positive for numbness. Negative for dizziness, weakness and headaches.  All other systems reviewed and are negative.     Allergies  Bactrim; Codeine; Hydrocodone; and Ibuprofen  Home Medications   Prior to Admission medications   Medication Sig Start Date End Date Taking? Authorizing Provider  Calcium Carbonate-Vitamin D (CALCIUM-VITAMIN D) 600-200 MG-UNIT CAPS Take by mouth 2 (two) times daily.      Historical Provider, MD  dicyclomine (BENTYL) 10 MG capsule 1-2 tabs po tid prn 11/15/10 10/23/13  Rosalita Chessman, DO  Multiple Vitamins-Minerals (HAIR/SKIN/NAILS) TABS Take 1 tablet by mouth daily.    Historical Provider, MD  ondansetron (ZOFRAN ODT) 4 MG disintegrating tablet 4mg  ODT q4 hours prn nausea/vomit  05/04/14   Julianne Rice, MD  oxyCODONE-acetaminophen (PERCOCET) 5-325 MG per tablet Take 1 tablet by mouth every 6 (six) hours as needed for severe pain. 05/04/14   Julianne Rice, MD  vitamin E 1000 UNIT capsule Take 1,000 Units by mouth daily.    Historical Provider, MD   Ht 5' (1.524 m)  Wt 140 lb (63.504 kg)  BMI 27.34 kg/m2 Physical Exam  Constitutional: She is oriented to person, place, and time. She appears well-developed and well-nourished. No distress.  HENT:  Head: Normocephalic and atraumatic.  Mouth/Throat: Oropharynx is clear and moist.  Eyes: EOM are normal. Pupils are equal, round, and reactive to light.  Neck: Normal range of motion. Neck supple.   Cardiovascular: Normal rate and regular rhythm.   Pulmonary/Chest: Effort normal and breath sounds normal. No respiratory distress. She has no wheezes. She has no rales. She exhibits no tenderness.  Abdominal: Soft. Bowel sounds are normal. She exhibits no distension and no mass. There is no tenderness. There is no rebound and no guarding.  Musculoskeletal: Normal range of motion. She exhibits tenderness. She exhibits no edema.  Tenderness to palpation in the left paraspinal lumbar region. Negative straight leg raise. 2+ dorsalis pedis and posterior tibial bilaterally. No calf swelling or tenderness bilaterally.  Neurological: She is alert and oriented to person, place, and time.  Sensation is fully intact. 5/5 motor in all extremities. No saddle anesthesia.  Skin: Skin is warm and dry. No rash noted. No erythema.  Psychiatric: She has a normal mood and affect. Her behavior is normal.  Nursing note and vitals reviewed.   ED Course  Procedures (including critical care time)  COORDINATION OF CARE: 12:42 AM Discussed treatment plan with patient at beside, the patient agrees with the plan and has no further questions at this time.   Labs Review Labs Reviewed - No data to display  Imaging Review Dg Lumbar Spine Complete  05/02/2014   CLINICAL DATA:  Lumbar spine pain. Injury 3 weeks prior picking up a box. Pain primarily on the left side radiating to the foot.  EXAM: LUMBAR SPINE - COMPLETE 4+ VIEW  COMPARISON:  None.  FINDINGS: The alignment is maintained. Vertebral body heights are normal. No fracture. There is disc space narrowing at L4-L5 with associated minimal anterolisthesis. Disc space narrowing at L2-L3 with endplate spurring. There is multilevel facet arthropathy throughout the lumbar spine. The bones appear under mineralized. Sacroiliac joints are congruent.  IMPRESSION: 1. No acute bony abnormality. 2. Multilevel degenerative disc disease and facet arthropathy, most significant at  L2-L3 and L4-L5. Mild anterolisthesis of L4 on L5 appears degenerative in etiology.   Electronically Signed   By: Jeb Levering M.D.   On: 05/02/2014 18:18   Dg Hip Unilat With Pelvis 2-3 Views Left  05/02/2014   CLINICAL DATA:  LEFT hip pain. Initial encounter. Radiations of the LEFT foot.  EXAM: LEFT HIP (WITH PELVIS) 2-3 VIEWS  COMPARISON:  None.  FINDINGS: Pelvic rings intact. Hip joint spaces are symmetric. Negative for fracture.  IMPRESSION: Negative.   Electronically Signed   By: Dereck Ligas M.D.   On: 05/02/2014 18:17     EKG Interpretation None      MDM   Final diagnoses:  Lumbar radiculopathy    Patient states she has had Percocet in the past and tolerated this. Given the emergency department and observed. Patient with recent workup including x-rays, laboratory tests and urinalysis. No acute findings. They should schedule for MRI today. Will attempt  pain control with oral medication Implanon discharged later. No red flag signs or symptoms concerning for cauda equina, epidural abscess or any other surgical emergencies.  I personally performed the services described in this documentation, which was scribed in my presence. The recorded information has been reviewed and is accurate.  Patient tolerated Percocet well. We'll discharge home with same. Return precautions given.  Julianne Rice, MD 05/04/14 [phone]

## 2014-05-05 ENCOUNTER — Telehealth: Payer: Self-pay | Admitting: Family Medicine

## 2014-05-05 DIAGNOSIS — M48061 Spinal stenosis, lumbar region without neurogenic claudication: Secondary | ICD-10-CM

## 2014-05-05 MED ORDER — ONDANSETRON 4 MG PO TBDP: ORAL_TABLET | ORAL | Status: DC

## 2014-05-05 NOTE — Telephone Encounter (Signed)
Notes Recorded by Rosalita Chessman, DO on 05/04/2014 at 3:26 PM Lumbar stenosis and disc bulge--- refer to neurosurgery

## 2014-05-05 NOTE — Telephone Encounter (Signed)
Relation to pt: self  Call back number: [phone]   Reason for call:  Pt is inquiring about MRI results

## 2014-05-05 NOTE — Telephone Encounter (Signed)
Patient has been made aware and verbalized understanding. Ref has been placed. She stated she is taking the Oxycodone every 7 hours and got a quantity of #20 on 05/04/14. She said she does not want to run out and is requesting a refill as well as a refill on the Zofran she ws given #8 on 05/04/14   Please advise in Dr.Lownes absence.       KP

## 2014-05-05 NOTE — Telephone Encounter (Signed)
SHE CALLED THE PHARMACY AND THE RX IS THERE.  SHE SAYS THE INSURANCE COMPANY WANTS TO KNOW WHAT SHE IS TAKING THIS FOR BEFORE THEY WILL GIVE HER THE MEDS.  [place]

## 2014-05-05 NOTE — Telephone Encounter (Signed)
Left message for pt to return my call.

## 2014-05-05 NOTE — Telephone Encounter (Signed)
Spoke with pharmacy and verified that PA is required for zofran Rx. Form emailed to me, completed and faxed to [facility] at [phone]. Awaiting determination.

## 2014-05-05 NOTE — Telephone Encounter (Signed)
I have sent rx for refill on Zofran. Dr. Etter Sjogren will be back on Monday and I will defer oxycodone refills to her upon her return. Pt should have enough to cover her until Dr. Etter Sjogren returns.

## 2014-05-07 NOTE — Telephone Encounter (Signed)
Penny Lamb from [place] called states the rx has been denied, will be sending a letter out to both the pt and provider, any questions call [phone].

## 2014-05-10 ENCOUNTER — Telehealth: Payer: Self-pay | Admitting: *Deleted

## 2014-05-10 NOTE — Telephone Encounter (Signed)
[name]-- pt states that narcotics cause her to have nausea. She requests med for nausea so she will be able to take her pain medication.  Is there something else we could try for nausea?

## 2014-05-10 NOTE — Telephone Encounter (Signed)
She needs ov and should probably bring her formulary so we know what they will pay for

## 2014-05-10 NOTE — Telephone Encounter (Signed)
Noted.  I recommend she follow up with Dr. Etter Sjogren in the office if she is still having nausea for further evaluation.

## 2014-05-10 NOTE — Telephone Encounter (Signed)
Prior authorization for ondansetron initiated. Awaiting determination. JG//CMA

## 2014-05-10 NOTE — Telephone Encounter (Signed)
I will defer to PCP please, zofran is the medication I usually use in pt's in her age group and I am not comfortable calling in another med.  Dr. Etter Sjogren, please advise.

## 2014-05-11 NOTE — Telephone Encounter (Signed)
Spoke with PT states that she is feeling better and no longer taking the narcotics. Informed PT that Dr. Etter Sjogren would like to see her for OV- PT declined.

## 2014-05-11 NOTE — Telephone Encounter (Signed)
She does not need to come in if she does not need med

## 2014-05-13 NOTE — Telephone Encounter (Signed)
Penny Lamb from [place]  Penny Lamb states that patient was very irate over this. Prior [name] has been denied, not an FDA labeled indication. We will be receiving a letter regarding this and we can always appeal.

## 2014-05-13 NOTE — Telephone Encounter (Signed)
Will submit appeal once paperwork is received. JG//CMA

## 2014-05-14 NOTE — Telephone Encounter (Signed)
Appeal started. 

## 2014-05-17 ENCOUNTER — Telehealth: Payer: Self-pay

## 2014-05-17 ENCOUNTER — Telehealth: Payer: Self-pay | Admitting: Family Medicine

## 2014-05-17 ENCOUNTER — Ambulatory Visit (INDEPENDENT_AMBULATORY_CARE_PROVIDER_SITE_OTHER): Payer: Medicare Other | Admitting: Family Medicine

## 2014-05-17 VITALS — BP 118/70 | HR 76 | Temp 98.2°F | Resp 18 | Ht 61.0 in | Wt 144.0 lb

## 2014-05-17 DIAGNOSIS — H53453 Other localized visual field defect, bilateral: Secondary | ICD-10-CM | POA: Diagnosis not present

## 2014-05-17 NOTE — Telephone Encounter (Signed)
error:[id number] ° °

## 2014-05-17 NOTE — Telephone Encounter (Signed)
Patient would like for Dr. Joseph Art to give her a call back about her eyes. Cb# [phone].

## 2014-05-17 NOTE — Telephone Encounter (Signed)
Patient called back regarding this. Call transferred to [facility].

## 2014-05-17 NOTE — Telephone Encounter (Signed)
Please see triage note under separate encounter.

## 2014-05-17 NOTE — Progress Notes (Signed)
This is a 69 year old woman who developed back pain about 10 days ago and was seen by her doctor who put her on prednisone. About 7 days ago she developed scotoma described as flashing lights in both visual fields both eyes as well as sensible cobweb tripping over her visual fields.  She called her doctor who is nurse instruct her to continue the prednisone. Nevertheless she tapered it somewhat and his symptoms of gotten better. She's experienced no eye pain.patient does not see these scotoma when she closes her eyes  Patient was brought here by a friend  Objective:BP 118/70 mmHg  Pulse 76  Temp(Src) 98.2 F (36.8 C) (Oral)  Resp 18  Ht 5\' 1"  (1.549 m)  Wt 144 lb (65.318 kg)  BMI 27.22 kg/m2  SpO2 97% Both eyes are slightly injected Pupils equal and reactive to light and accommodation No bleeding is seen and no detached retinas  Identified.  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 She agrees to get an ophthalmology consultation today.  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 I explained this to her and told her that to be sure we would try to get her into an ophthalmologist's office ASAP  I asked the staff to contact Dr. Zenia Resides office. There were unsuccessful and when they went back to discuss this, patient became angry and left. I was informed after patient had left the building.  Signed, Carola Frost.D.

## 2014-05-17 NOTE — Telephone Encounter (Signed)
Patient Name: Penny Lamb DOB: 1945-07-04 Initial Comment Caller States she is having blurry vision seeing spots and flashes of light also cobwebs Nurse Assessment Nurse: Marcelline Deist, RN, Lynda Date/Time (Eastern Time): 05/17/2014 12:59:18 PM Confirm and document reason for call. If symptomatic, describe symptoms. ---Caller states she is having blurry vision, seeing spots and flashes of light, also cobwebs. Has been on Prednisone, on the 3rd day started seeing lightening flashes & black spots. Has a family hx. of detached retina. Has bulging discs, spinal stenosis, put on steroids. Has had these symptoms for 9 days now. Has the patient traveled out of the country within the last 30 days? ---Not Applicable Does the patient require triage? ---Yes Related visit to physician within the last 2 weeks? ---Yes Does the PT have any chronic conditions? (i.e. diabetes, asthma, etc.) ---Yes List chronic conditions. ---spinal issues, bulging discs, asthma, IBS Guidelines Guideline Title Affirmed Question Affirmed Notes Vision Loss or Change [1] Blurred vision or visual changes AND [2] present now AND [3] sudden onset or new (e.g., minutes, hours, days) (Exception: previously diagnosed migraine headaches with same symptoms) Final Disposition User Go to ED Now (or PCP triage) Marcelline Deist, RN, Lynda Comments Caller would like a referral to see an eye specialist for this issue. Spoke with nurse via backline for further advice. She will speak with the provider re: pt's symptoms & need for referral & they will call pt.

## 2014-05-17 NOTE — Telephone Encounter (Signed)
PT DID GO TO [facility]

## 2014-05-17 NOTE — Telephone Encounter (Signed)
Just Fyi, Dr L.  Pt states she is not feeling well in general. Pt was upset because we didn't have the equipment to fix her eye when someone at the office told her we did. I informed her we have the equipment to evaluate her eye, but not necessarily all of the equipment to fix whatever problem it is that she may have. Pt says the prednisone is making her ill and she couldn't stay here any longer. Pt states she is going to neurosurgeon tomorrow and she just wants to be left alone "in the quiet". Pt wanted to apologize to Dr L. She says he was very nice and she didn't want to offend him.  Of note, I was on the phone with Dr Zenia Resides office trying to get her worked in over there when [place], Licensed conveyancer, came and told me the pt had left.

## 2014-05-17 NOTE — Telephone Encounter (Addendum)
Pt requesting to speak with RN in regards to previous conversation. Best #

## 2014-05-17 NOTE — Telephone Encounter (Signed)
Per Dr.Lowne the patient would need to be seen before she can refer her to an Therapist, art.      KP

## 2014-05-17 NOTE — Telephone Encounter (Signed)
Relation to pt: self   Call back [place]:   Reason for call:  Pt was seen in ED 05/04/14 and prednisone was prescribed pt exp. blurry vision, fatigue, and feeling ill. Pt states she is not 100% but wanted to make MD aware of symptoms. Pt has a neurologist appointment 05/18/14  and wanted to wait until after appointment to see MD. ED follow up scheduled with PCP 05/27/14 at 11:30am

## 2014-05-17 NOTE — Telephone Encounter (Signed)
Dr Etter Sjogren-- Would you like to schedule the patient or refer?

## 2014-05-17 NOTE — Telephone Encounter (Signed)
Per Dr Etter Sjogren, patient needs to be seen. Advised patient and gave next available appt which is Tues 4/26 @ 11am.  Patient states that she does not want to wait that long. Advised to got the [facility]. They have the equipment to check her eye and get her referred today if it is urgent. Patient agrees with plan and states that she would go now.

## 2014-05-17 NOTE — Telephone Encounter (Signed)
To MD for review     KP 

## 2014-05-18 ENCOUNTER — Telehealth: Payer: Self-pay

## 2014-05-18 ENCOUNTER — Encounter: Payer: Self-pay | Admitting: Family Medicine

## 2014-05-18 NOTE — Telephone Encounter (Signed)
Patient called in to advise me that she went to the urgent care and there was nothing that they could do to help her. She stated that I did not listen to her and understand what she needed, that she needed to be referred opthalmology and I sent her somewhere who couldn't do anything at all. Patient stated that she has a call in to opthalmologic which is where I should have sent her in the first.  Attempted to explain what the provider was attempting to do before she walked out, patient continued yelling at me and stated that "you are the most incompetent nurse I've ever talked to and you need to learn a little something missy". Patient further stated that "you have no idea what it is like to take care of someone.  Ask patient to stop yelling or I would have to disconnect the call, patient continue yelling so I advised that I would be hanging up. Disconnected.  Scientist, research (life sciences).

## 2014-05-18 NOTE — Telephone Encounter (Signed)
Pt was told she needed to be seen in order to do a referral for oph-- she went to [facility] who tried to get her in with Dr Katy Fitch but pt left angry before app could be made---- pt was very abusive to staff on phone.  She will be d/c from practice.

## 2014-05-19 ENCOUNTER — Telehealth: Payer: Self-pay | Admitting: Family Medicine

## 2014-05-19 NOTE — Telephone Encounter (Signed)
Patient dismissed from [facility] by Garnet Koyanagi MD , effective May 18, 2014. Dismissal letter sent out by certified / registered mail.  DAJ  Received signed domestic return receipt verifying delivery of certified letter on May 24, 2014. Article number [id number] [place]

## 2014-05-26 ENCOUNTER — Encounter: Payer: Self-pay | Admitting: Family Medicine

## 2014-05-26 NOTE — Telephone Encounter (Signed)
Magda Paganini from [place] called.   Needs to see if patient has tried two alternative medications? [phone]

## 2014-05-27 ENCOUNTER — Ambulatory Visit: Payer: Medicare Other | Admitting: Family Medicine

## 2014-05-27 NOTE — Telephone Encounter (Signed)
Called and provided requested information. PA has been sent for clinical review and they will inform of of decision. JG//CMA

## 2014-06-08 ENCOUNTER — Telehealth: Payer: Self-pay | Admitting: *Deleted

## 2014-06-08 NOTE — Telephone Encounter (Signed)
Signed medical record release received via fax from [facility]. Forwarded to [place] to scan/email to medical records. JG//CMA

## 2014-10-20 ENCOUNTER — Ambulatory Visit (INDEPENDENT_AMBULATORY_CARE_PROVIDER_SITE_OTHER): Payer: Medicare Other | Admitting: Allergy and Immunology

## 2014-10-20 ENCOUNTER — Encounter: Payer: Self-pay | Admitting: Allergy and Immunology

## 2014-10-20 VITALS — BP 118/78 | HR 68 | Resp 18

## 2014-10-20 DIAGNOSIS — Z889 Allergy status to unspecified drugs, medicaments and biological substances status: Secondary | ICD-10-CM

## 2014-10-20 NOTE — Progress Notes (Signed)
Penny Lamb returns to this clinic in investigation of lidocaine reaction. She underwent an incremental challenge with 2% lidocaine (without Epi) over the course of approximately 3.5 hours. She received lidocaine via epicutaneous, intradermal and subcutaneous route. She did not demonstrate any reaction against this exposure. She had a total of approximately 1.5 ml administered.

## 2014-10-20 NOTE — Patient Instructions (Signed)
Return to see dentist. You had a challenge with 2% lidocaine and had no problem with this challenge.

## 2016-07-06 ENCOUNTER — Other Ambulatory Visit: Payer: Self-pay | Admitting: Family Medicine

## 2016-07-06 DIAGNOSIS — K5792 Diverticulitis of intestine, part unspecified, without perforation or abscess without bleeding: Secondary | ICD-10-CM

## 2016-07-10 ENCOUNTER — Ambulatory Visit
Admission: RE | Admit: 2016-07-10 | Discharge: 2016-07-10 | Disposition: A | Discharge status: Home / Self Care | Payer: Medicare Other | Source: Ambulatory Visit | Attending: Family Medicine | Admitting: Family Medicine

## 2016-07-10 DIAGNOSIS — K5792 Diverticulitis of intestine, part unspecified, without perforation or abscess without bleeding: Secondary | ICD-10-CM

## 2016-07-10 MED ORDER — IOPAMIDOL (ISOVUE-300) INJECTION 61%
100.0000 mL | Freq: Once | INTRAVENOUS | Status: AC | PRN
  Administered 2016-07-10: 100 mL via INTRAVENOUS

## 2017-02-04 DIAGNOSIS — R079 Chest pain, unspecified: Secondary | ICD-10-CM | POA: Diagnosis not present

## 2017-02-04 DIAGNOSIS — Z87891 Personal history of nicotine dependence: Secondary | ICD-10-CM | POA: Diagnosis not present

## 2017-02-04 DIAGNOSIS — R062 Wheezing: Secondary | ICD-10-CM | POA: Diagnosis not present

## 2017-02-13 ENCOUNTER — Other Ambulatory Visit: Payer: Self-pay | Admitting: Family Medicine

## 2017-02-13 DIAGNOSIS — Z87891 Personal history of nicotine dependence: Secondary | ICD-10-CM

## 2017-02-13 DIAGNOSIS — R079 Chest pain, unspecified: Secondary | ICD-10-CM

## 2017-02-13 DIAGNOSIS — R062 Wheezing: Secondary | ICD-10-CM

## 2017-02-18 ENCOUNTER — Ambulatory Visit
Admission: RE | Admit: 2017-02-18 | Discharge: 2017-02-18 | Disposition: A | Discharge status: Home / Self Care | Payer: Medicare Other | Source: Ambulatory Visit | Attending: Family Medicine | Admitting: Family Medicine

## 2017-02-18 DIAGNOSIS — J439 Emphysema, unspecified: Secondary | ICD-10-CM | POA: Diagnosis not present

## 2017-02-18 DIAGNOSIS — R079 Chest pain, unspecified: Secondary | ICD-10-CM

## 2017-02-18 DIAGNOSIS — Z87891 Personal history of nicotine dependence: Secondary | ICD-10-CM

## 2017-02-18 DIAGNOSIS — R062 Wheezing: Secondary | ICD-10-CM

## 2017-02-19 DIAGNOSIS — R69 Illness, unspecified: Secondary | ICD-10-CM | POA: Diagnosis not present

## 2017-03-04 DIAGNOSIS — G4733 Obstructive sleep apnea (adult) (pediatric): Secondary | ICD-10-CM | POA: Diagnosis not present

## 2017-03-04 DIAGNOSIS — I2510 Atherosclerotic heart disease of native coronary artery without angina pectoris: Secondary | ICD-10-CM | POA: Diagnosis not present

## 2017-03-04 DIAGNOSIS — R0789 Other chest pain: Secondary | ICD-10-CM | POA: Diagnosis not present

## 2017-03-11 ENCOUNTER — Ambulatory Visit: Payer: Medicare HMO | Admitting: Internal Medicine

## 2017-03-11 ENCOUNTER — Encounter: Payer: Self-pay | Admitting: Internal Medicine

## 2017-03-11 VITALS — BP 116/70 | HR 71 | Ht 60.0 in | Wt 140.8 lb

## 2017-03-11 DIAGNOSIS — J449 Chronic obstructive pulmonary disease, unspecified: Secondary | ICD-10-CM | POA: Insufficient documentation

## 2017-03-11 MED ORDER — BUDESONIDE-FORMOTEROL FUMARATE 80-4.5 MCG/ACT IN AERO: 2.0000 | INHALATION_SPRAY | Freq: Two times a day (BID) | RESPIRATORY_TRACT | 0 refills | Status: DC

## 2017-03-11 MED ORDER — BUDESONIDE-FORMOTEROL FUMARATE 80-4.5 MCG/ACT IN AERO: 2.0000 | INHALATION_SPRAY | Freq: Two times a day (BID) | RESPIRATORY_TRACT | 11 refills | Status: DC

## 2017-03-11 NOTE — Patient Instructions (Signed)
symbicort 80 up to 2 puffs every 12 hours as needed for breathing or coughing    If you are satisfied with your treatment plan,  let your doctor know and he/she can either refill your medications or you can return here when your prescription runs out.     If in any way you are not 100% satisfied,  please tell us.  If 100% better, tell your friends!  Pulmonary follow up is as needed

## 2017-03-11 NOTE — Assessment & Plan Note (Signed)
Quit smoking 01/2017 Spirometry 03/11/2017  FEV1 1.37 (75%)  Ratio 67  - 03/11/2017  After extensive coaching inhaler device  effectiveness =    75% > try symbicort 80 2bid      I reviewed the Fletcher curve with the patient that basically indicates  if you quit smoking when your best day FEV1 is still well preserved (as is clearly  the case here)  it is highly unlikely you will progress to severe disease and informed the patient there was  no medication on the market that has proven to alter the curve/ its downward trajectory  or the likelihood of progression of their disease(unlike other chronic medical conditions such as atheroclerosis where we do think we can change the natural hx with risk reducing meds)    Therefore stopping smoking and maintaining abstinence are  the most important aspects of care, not choice of inhalers or for that matter, doctors.   Treatment other than smoking cessation  is entirely directed by severity of symptoms and focused also on reducing exacerbations, not attempting to change the natural history of the disease.  Not clear her cp is related to copd but the cough on exp probably represents AB and the cp may be mscp from coughing since clinical course parallels the cough in terms of severity   rec try low dose symb ie  80 2bid to see if helps the symptoms and if so can just use it "prn" going forward and pulmonary f/u can be prn as well   Total time devoted to counseling  > 50 % of initial 60 min office visit:  review case with pt/ discussion of options/alternatives/ personally creating written customized instructions  in presence of pt  then going over those specific  Instructions directly with the pt including how to use all of the meds but in particular covering each new medication in detail and the difference between the maintenance= "automatic" meds and the prns using an action plan format for the latter (If this problem/symptom => do that organization reading Left to  right).  Please see AVS from this visit for a full list of these instructions which I personally wrote for this pt and  are unique to this visit.

## 2017-03-11 NOTE — Progress Notes (Signed)
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 Copd /ab   03/11/2017 1st [facility] office visit/ Wert   Chief Complaint  Patient presents with  . Pulmonary Consult    Referred by Dr, Lennette Bihari Via. Pt c/o left side CP "constant" x 3-4 months. She states "it's hard to describe, but something is there".   new onset L parastenal cp 3-4 months prior to OV   most noticeable after going to bed and for years not able to lie flat and so sleeps right or left side down on cpap / cp persistent 24/7 since onset but certainly not "constant" =  worse with coughing and both cough and cp are better since quit smoking maybe 50%.overall Doe .MMRC1 = can walk nl pace, flat grade, can't hurry or go uphills or steps s sob   Has used saba in past but not in last few months Pain is very localized just the the L of angle of Luis s rad or worse with ex, no assoc n or v or diaphoresis when at it's worse nor affected by using arms/ eating or belching   No obvious other patterns in day to day or daytime variability or assoc excess/ purulent sputum or mucus plugs or hemoptysis or   chest tightness, subjective wheeze or overt sinus or hb symptoms. No unusual exposure hx or h/o childhood pna/ asthma or knowledge of premature birth.  Sleeping ok horizontal on either side without nocturnal  or early am exacerbation  of respiratory  c/o's or need for noct saba. Also denies any obvious fluctuation of symptoms with weather or environmental changes or other aggravating or alleviating factors except as outlined above.  Current Allergies, Complete Past Medical History, Past  Surgical History, Family History, and Social History were reviewed in [employer] record.  ROS  The following are not active complaints unless bolded Hoarseness, sore throat, dysphagia, dental problems, itching, sneezing,  nasal congestion or discharge of excess mucus or purulent secretions, ear ache,   fever, chills, sweats, unintended wt loss or wt gain, classically  exertional cp,  orthopnea pnd or leg swelling, presyncope, palpitations, abdominal pain, anorexia, nausea, vomiting, diarrhea  or change in bowel habits or change in bladder habits, change in stools or change in urine, dysuria, hematuria,  rash, arthralgias, visual complaints, headache, numbness, weakness or ataxia or problems with walking or coordination,  change in mood/affect or memory.        Current Meds  Medication Sig  . Calcium Carbonate-Vitamin D (CALCIUM-VITAMIN D) 600-200 MG-UNIT CAPS Take by mouth 2 (two) times daily.    . Multiple Vitamins-Minerals (HAIR/SKIN/NAILS) TABS Take 1 tablet by mouth daily.            Review of Systems     Objective:   Physical Exam  amb somber wf nad   Wt Readings from Last 3 Encounters:  03/11/17 140 lb 12.8 oz (63.9 kg)  05/17/14 144 lb (65.3 kg)  05/04/14 140 lb (63.5 kg)     Vital signs reviewed - Note on arrival 02 sats  96% on RA      HEENT: nl dentition, turbinates bilaterally, and oropharynx. Nl external ear canals without cough reflex   NECK :  without JVD/Nodes/TM/ nl carotid upstrokes bilaterally   LUNGS: no acc muscle use,  slt barrel contour with distant bs and trace mid to late end exp wheeze/cough  CV:  RRR  no s3 or murmur or increase in P2, and no edema   ABD:  soft and nontender with nl inspiratory excursion in the supine position. No bruits or organomegaly appreciated, bowel sounds nl  MS:  Nl gait/ ext warm without deformities, calf tenderness, cyanosis or clubbing No obvious joint restrictions   SKIN: warm and dry  without lesions    NEURO:  alert, approp, nl sensorium with  no motor or cerebellar deficits apparent.       I personally reviewed images and agree with radiology impression as follows:   Chest CT  02/19/17  1. No active cardiopulmonary abnormalities. 2. Mild diffuse bronchial wall thickening with emphysema, as above; imaging findings suggestive of underlying COPD. Emphysema       Assessment:

## 2017-03-19 DIAGNOSIS — I2510 Atherosclerotic heart disease of native coronary artery without angina pectoris: Secondary | ICD-10-CM | POA: Diagnosis not present

## 2017-03-21 DIAGNOSIS — G4733 Obstructive sleep apnea (adult) (pediatric): Secondary | ICD-10-CM | POA: Diagnosis not present

## 2017-03-21 DIAGNOSIS — I2510 Atherosclerotic heart disease of native coronary artery without angina pectoris: Secondary | ICD-10-CM | POA: Diagnosis not present

## 2017-03-21 DIAGNOSIS — R0789 Other chest pain: Secondary | ICD-10-CM | POA: Diagnosis not present

## 2017-05-29 DIAGNOSIS — X32XXXD Exposure to sunlight, subsequent encounter: Secondary | ICD-10-CM | POA: Diagnosis not present

## 2017-05-29 DIAGNOSIS — L570 Actinic keratosis: Secondary | ICD-10-CM | POA: Diagnosis not present

## 2017-07-24 DIAGNOSIS — Z85828 Personal history of other malignant neoplasm of skin: Secondary | ICD-10-CM | POA: Diagnosis not present

## 2017-07-24 DIAGNOSIS — Z87891 Personal history of nicotine dependence: Secondary | ICD-10-CM | POA: Diagnosis not present

## 2017-07-24 DIAGNOSIS — J449 Chronic obstructive pulmonary disease, unspecified: Secondary | ICD-10-CM | POA: Diagnosis not present

## 2017-07-24 DIAGNOSIS — Z8249 Family history of ischemic heart disease and other diseases of the circulatory system: Secondary | ICD-10-CM | POA: Diagnosis not present

## 2017-07-24 DIAGNOSIS — Z7951 Long term (current) use of inhaled steroids: Secondary | ICD-10-CM | POA: Diagnosis not present

## 2017-07-31 DIAGNOSIS — R69 Illness, unspecified: Secondary | ICD-10-CM | POA: Diagnosis not present

## 2017-08-06 DIAGNOSIS — R69 Illness, unspecified: Secondary | ICD-10-CM | POA: Diagnosis not present

## 2017-08-21 DIAGNOSIS — K1379 Other lesions of oral mucosa: Secondary | ICD-10-CM | POA: Diagnosis not present

## 2017-08-21 DIAGNOSIS — K1322 Minimal keratinized residual ridge mucosa: Secondary | ICD-10-CM | POA: Diagnosis not present

## 2017-08-21 DIAGNOSIS — R69 Illness, unspecified: Secondary | ICD-10-CM | POA: Diagnosis not present

## 2017-08-22 ENCOUNTER — Encounter (HOSPITAL_COMMUNITY): Payer: Self-pay | Admitting: Emergency Medicine

## 2017-08-22 ENCOUNTER — Emergency Department (HOSPITAL_COMMUNITY)
Admission: EM | Admit: 2017-08-22 | Discharge: 2017-08-22 | Disposition: A | Discharge status: Home / Self Care | Payer: Medicare HMO | Attending: Emergency Medicine | Admitting: Emergency Medicine

## 2017-08-22 DIAGNOSIS — I10 Essential (primary) hypertension: Secondary | ICD-10-CM | POA: Diagnosis not present

## 2017-08-22 DIAGNOSIS — Z87891 Personal history of nicotine dependence: Secondary | ICD-10-CM | POA: Insufficient documentation

## 2017-08-22 DIAGNOSIS — R112 Nausea with vomiting, unspecified: Secondary | ICD-10-CM | POA: Insufficient documentation

## 2017-08-22 DIAGNOSIS — Z79899 Other long term (current) drug therapy: Secondary | ICD-10-CM | POA: Insufficient documentation

## 2017-08-22 DIAGNOSIS — R110 Nausea: Secondary | ICD-10-CM | POA: Diagnosis not present

## 2017-08-22 DIAGNOSIS — R1111 Vomiting without nausea: Secondary | ICD-10-CM | POA: Diagnosis not present

## 2017-08-22 DIAGNOSIS — J449 Chronic obstructive pulmonary disease, unspecified: Secondary | ICD-10-CM | POA: Insufficient documentation

## 2017-08-22 LAB — I-STAT CHEM 8, ED
BUN: 14 mg/dL (ref 8–23)
CREATININE: 0.7 mg/dL (ref 0.44–1.00)
Calcium, Ion: 1.15 mmol/L (ref 1.15–1.40)
Chloride: 100 mmol/L (ref 98–111)
Glucose, Bld: 118 mg/dL — ABNORMAL HIGH (ref 70–99)
HCT: 40 % (ref 36.0–46.0)
HEMOGLOBIN: 13.6 g/dL (ref 12.0–15.0)
POTASSIUM: 4.3 mmol/L (ref 3.5–5.1)
SODIUM: 138 mmol/L (ref 135–145)
TCO2: 28 mmol/L (ref 22–32)

## 2017-08-22 MED ORDER — ONDANSETRON 4 MG PO TBDP: 4.0000 mg | ORAL_TABLET | Freq: Three times a day (TID) | ORAL | 0 refills | Status: DC | PRN

## 2017-08-22 MED ORDER — SODIUM CHLORIDE 0.9 % IV BOLUS
1000.0000 mL | Freq: Once | INTRAVENOUS | Status: AC
  Administered 2017-08-22: 1000 mL via INTRAVENOUS

## 2017-08-22 MED ORDER — ACETAMINOPHEN 325 MG PO TABS
650.0000 mg | ORAL_TABLET | Freq: Once | ORAL | Status: AC
  Administered 2017-08-22: 650 mg via ORAL
  Filled 2017-08-22: qty 2

## 2017-08-22 MED ORDER — PROMETHAZINE HCL 25 MG PO TABS
12.5000 mg | ORAL_TABLET | Freq: Once | ORAL | Status: AC
  Administered 2017-08-22: 12.5 mg via ORAL
  Filled 2017-08-22: qty 1

## 2017-08-22 NOTE — Discharge Instructions (Addendum)
Stop taking tramadol and start tylenol every six hours as needed for pain.   I have written you a prescription for Zofran at home as needed for nausea.   Return to the ER if you have any new or concerning symptoms like abdominal pain, fever, vomiting that will not stop or feeling as if you are going to pass out.

## 2017-08-22 NOTE — ED Notes (Signed)
This nurse gave the pt some ginger-ale, raised the [facility] and turned the light on to try to help the pt be less sleepy.  Pt continues to request the Penny Lamb be lowered and lights turned off, stating she has a headache.

## 2017-08-22 NOTE — ED Triage Notes (Signed)
Patient underwent dental work yesterday and has been taking tramadol for the pain since. She has been experiencing nausea after she started using tramadol. EMS states they noticed her dry heaving. She was given 4mg  of Zofran by EMS

## 2017-08-22 NOTE — ED Notes (Signed)
Pt is wanting to know if its ok to have an ice pack for her face.

## 2017-08-22 NOTE — ED Notes (Signed)
Pt given water 

## 2017-08-22 NOTE — ED Notes (Signed)
Pt ambulated to bathroom.  Tolerated well. Pt drank water with tylenol, she had some nausea.  She reached for emesis bag but did not vomit.

## 2017-08-22 NOTE — ED Provider Notes (Signed)
[place] DEPT Provider Note   CSN: [id number] Arrival date & time: 08/22/17  1448     History   Chief Complaint Chief Complaint  Patient presents with  . Nausea    HPI Penny Lamb is a 72 y.o. female.  HPI   Penny Lamb is a 72yo female with no significant past medical history who presents to the emergency department via EMS for evaluation of nausea.  Patient reports that she had dental work done yesterday and was prescribed tramadol for pain.  Took the tramadol twice yesterday, but has been nauseous and had vomiting ever since.  She reports that she reacts poorly to narcotic pain medicine including Vicodin and Percocet which is why she was prescribed tramadol.  She has vomited about 4 times.  States she feels sick to her stomach but denies focal abdominal pain.  She denies fevers, chills, hematemesis, diarrhea, dysuria, urinary frequency, lightheadedness, syncope, weakness, chest pain, shortness of breath.  She has had a prior hysterectomy, no other reported abdominal surgeries. She received 4mg  Zofran in route.   Past Medical History:  Diagnosis Date  . Allergy    RHINITIS  . Arthritis   . Asthma   . Cancer ([place])    SKIN  . Depression   . Headache(784.0)   . IBS (irritable bowel syndrome)   . Nephrolithiasis   . UTI (urinary tract infection)     Patient Active Problem List   Diagnosis Date Noted  . COPD GOLD II 03/11/2017  . Drug allergy 10/20/2014  . Genital herpes 12/31/2012  . Rib pain on right side 01/09/2012  . VAGINITIS, ATROPHIC 02/28/2010  . CYSTITIS 02/21/2010  . HEMATURIA UNSPECIFIED 02/21/2010  . FOOT PAIN, LEFT 10/21/2009  . DEPRESSION 09/14/2009  . ALLERGIC RHINITIS 09/14/2009  . ASTHMA 09/14/2009  . ARTIFICIAL MENOPAUSE 09/14/2009  . OSTEOARTHRITIS 09/14/2009  . HEADACHE 09/14/2009  . SKIN CANCER, HX OF 09/14/2009  . NEPHROLITHIASIS, HX OF 09/14/2009    Past Surgical History:  Procedure  Laterality Date  . ABDOMINAL HYSTERECTOMY    . BREAST SURGERY     BIOPSY.....CYST ASPIRATED  . COLON SURGERY    . EYE SURGERY Left 2013   lens implant, catarct L     OB History   None      Home Medications    Prior to Admission medications   Medication Sig Start Date End Date Taking? Authorizing Provider  budesonide-formoterol (SYMBICORT) 80-4.5 MCG/ACT inhaler Inhale 2 puffs into the lungs every 12 (twelve) hours. 03/11/17   Tanda Rockers, MD  budesonide-formoterol (SYMBICORT) 80-4.5 MCG/ACT inhaler Inhale 2 puffs into the lungs every 12 (twelve) hours. 03/11/17   Tanda Rockers, MD  Calcium Carbonate-Vitamin D (CALCIUM-VITAMIN D) 600-200 MG-UNIT CAPS Take by mouth 2 (two) times daily.      [provider]  Multiple Vitamins-Minerals (HAIR/SKIN/NAILS) TABS Take 1 tablet by mouth daily.    [provider]    Family History Family History  Problem Relation Age of Onset  . Cancer Other        SKIN  . Asthma Other   . Arthritis Other   . Hypertension Other   . Dementia Mother   . Cancer Mother 79       stomach cancer  . Dementia Brother     Social History Social History   Tobacco Use  . Smoking status: Former Smoker    Packs/day: 0.25    Years: 52.00    Pack years: 13.00  Types: Cigarettes    Last attempt to quit: 01/22/2017    Years since quitting: 0.5  . Smokeless tobacco: Never Used  Substance Use Topics  . Alcohol use: No    Alcohol/week: 0.0 oz  . Drug use: No     Allergies   Bactrim; Belladonna alkaloids; Codeine; Hydrocodone; Ibuprofen; Macrobid [nitrofurantoin macrocrystal]; and Prednisone   Review of Systems Review of Systems  Constitutional: Negative for chills and fever.  Respiratory: Negative for shortness of breath.   Cardiovascular: Negative for chest pain.  Gastrointestinal: Positive for nausea and vomiting. Negative for abdominal pain, blood in stool and diarrhea.  Genitourinary: Negative for difficulty urinating,  dysuria, flank pain and frequency.  Musculoskeletal: Negative for back pain.  Neurological: Negative for syncope, weakness, light-headedness, numbness and headaches.  Psychiatric/Behavioral: Negative for agitation.     Physical Exam Updated Vital Signs BP (!) 151/69   Pulse 65   Temp 97.8 F (36.6 C) (Oral)   Resp 16   SpO2 98%   Physical Exam  Constitutional: She is oriented to person, place, and time. She appears well-developed and well-nourished. No distress.  Curled up in bed, speaks in quiet voice and barely opens her eyes to questioning. States "I feel very nauseated."  HENT:  Head: Normocephalic and atraumatic.  Right lower molar absent with stitches noted above. No evidence of abscess.  No trismus. OP moist and clear. No oropharyngeal erythema or edema. Neck supple with no tenderness. No facial edema.  Eyes: Right eye exhibits no discharge. Left eye exhibits no discharge.  Cardiovascular: Normal rate and regular rhythm.  Pulmonary/Chest: Effort normal and breath sounds normal. No stridor. No respiratory distress. She has no wheezes. She has no rales.  Abdominal: Soft. Bowel sounds are normal. There is no tenderness.  Soft and non-distended. Abdomen non-tender to palpation. She gets nauseated with palpation of the epigastrium and dry heaves. No CVA tenderness.   Neurological: She is alert and oriented to person, place, and time. Coordination normal.  Skin: Skin is warm and dry. She is not diaphoretic.  Psychiatric: She has a normal mood and affect. Her behavior is normal.  Nursing note and vitals reviewed.   ED Treatments / Results  Labs (all labs ordered are listed, but only abnormal results are displayed) Labs Reviewed  I-STAT CHEM 8, ED - Abnormal; Notable for the following components:      Result Value   Glucose, Bld 118 (*)    All other components within normal limits    EKG None  Radiology No results found.  Procedures Procedures (including critical care  time)  Medications Ordered in ED Medications  sodium chloride 0.9 % bolus 1,000 mL ( Intravenous Stopped 08/22/17 0855)  promethazine (PHENERGAN) tablet 12.5 mg (12.5 mg Oral Given 08/22/17 0745)  acetaminophen (TYLENOL) tablet 650 mg (650 mg Oral Given 08/22/17 0910)     Initial Impression / Assessment and Plan / ED Course  I have reviewed the triage vital signs and the nursing notes.  Pertinent labs & imaging results that were available during my care of the patient were reviewed by me and considered in my medical decision making (see chart for details).     Presents with nausea after taking tramadol yesterday. No abdominal tenderness, fever or hematemesis. Patient treated with zofran and phenergan as well as IV fluid bolus. Chem 8 unremarkable. No major electrolyte abnormalities, no AKI. She does not appear dehydrated on exam. On exam no abdominal tenderness. I do not suspect acute abdominal process requiring  further lab work or imaging.  On recheck she is tolerating PO fluids at the bedside. She appears drowsy, is difficult to motivate to sit up. Will recheck after fluid bolus is complete.   I observed patient get up and go to the bathroom independently. On recheck she is drinking fluids and sitting up in the bed. She states she is ready to go home. Plan to discharge her with tylenol as needed for pain. Have counseled her to stop tramadol. Discussed return precautions and she agrees.   Final Clinical Impressions(s) / ED Diagnoses   Final diagnoses:  Non-intractable vomiting with nausea, unspecified vomiting type    ED Discharge Orders        Ordered    ondansetron (ZOFRAN ODT) 4 MG disintegrating tablet  Every 8 hours PRN     08/22/17 1131       Bernarda Caffey 08/22/17 1131    Lajean Saver, MD 08/22/17 1205

## 2017-08-29 DIAGNOSIS — K1379 Other lesions of oral mucosa: Secondary | ICD-10-CM | POA: Diagnosis not present

## 2017-08-29 DIAGNOSIS — K1329 Other disturbances of oral epithelium, including tongue: Secondary | ICD-10-CM | POA: Diagnosis not present

## 2017-08-30 DIAGNOSIS — B078 Other viral warts: Secondary | ICD-10-CM | POA: Diagnosis not present

## 2017-10-03 DIAGNOSIS — Z23 Encounter for immunization: Secondary | ICD-10-CM | POA: Diagnosis not present

## 2018-01-01 DIAGNOSIS — K589 Irritable bowel syndrome without diarrhea: Secondary | ICD-10-CM | POA: Diagnosis not present

## 2018-01-01 DIAGNOSIS — K121 Other forms of stomatitis: Secondary | ICD-10-CM | POA: Diagnosis not present

## 2018-01-01 DIAGNOSIS — Z1239 Encounter for other screening for malignant neoplasm of breast: Secondary | ICD-10-CM | POA: Diagnosis not present

## 2018-01-01 DIAGNOSIS — Z1159 Encounter for screening for other viral diseases: Secondary | ICD-10-CM | POA: Diagnosis not present

## 2018-01-03 DIAGNOSIS — Z1211 Encounter for screening for malignant neoplasm of colon: Secondary | ICD-10-CM | POA: Diagnosis not present

## 2018-01-06 ENCOUNTER — Other Ambulatory Visit: Payer: Self-pay | Admitting: Family Medicine

## 2018-01-06 DIAGNOSIS — Z1231 Encounter for screening mammogram for malignant neoplasm of breast: Secondary | ICD-10-CM

## 2018-01-10 DIAGNOSIS — Z0000 Encounter for general adult medical examination without abnormal findings: Secondary | ICD-10-CM | POA: Diagnosis not present

## 2018-01-10 DIAGNOSIS — Z1239 Encounter for other screening for malignant neoplasm of breast: Secondary | ICD-10-CM | POA: Diagnosis not present

## 2018-01-10 DIAGNOSIS — Z6829 Body mass index (BMI) 29.0-29.9, adult: Secondary | ICD-10-CM | POA: Diagnosis not present

## 2018-01-10 DIAGNOSIS — Z1159 Encounter for screening for other viral diseases: Secondary | ICD-10-CM | POA: Diagnosis not present

## 2018-01-10 DIAGNOSIS — Z1211 Encounter for screening for malignant neoplasm of colon: Secondary | ICD-10-CM | POA: Diagnosis not present

## 2018-01-10 DIAGNOSIS — K589 Irritable bowel syndrome without diarrhea: Secondary | ICD-10-CM | POA: Diagnosis not present

## 2018-01-10 DIAGNOSIS — Z136 Encounter for screening for cardiovascular disorders: Secondary | ICD-10-CM | POA: Diagnosis not present

## 2018-02-10 DIAGNOSIS — M7130 Other bursal cyst, unspecified site: Secondary | ICD-10-CM | POA: Diagnosis not present

## 2018-02-10 DIAGNOSIS — L308 Other specified dermatitis: Secondary | ICD-10-CM | POA: Diagnosis not present

## 2018-02-12 DIAGNOSIS — R69 Illness, unspecified: Secondary | ICD-10-CM | POA: Diagnosis not present

## 2018-02-14 ENCOUNTER — Ambulatory Visit
Admission: RE | Admit: 2018-02-14 | Discharge: 2018-02-14 | Disposition: A | Discharge status: Home / Self Care | Payer: Medicare HMO | Source: Ambulatory Visit | Attending: Family Medicine | Admitting: Family Medicine

## 2018-02-14 DIAGNOSIS — Z1231 Encounter for screening mammogram for malignant neoplasm of breast: Secondary | ICD-10-CM | POA: Diagnosis not present

## 2018-02-26 DIAGNOSIS — H02831 Dermatochalasis of right upper eyelid: Secondary | ICD-10-CM | POA: Diagnosis not present

## 2018-02-26 DIAGNOSIS — H02834 Dermatochalasis of left upper eyelid: Secondary | ICD-10-CM | POA: Diagnosis not present

## 2018-02-26 DIAGNOSIS — H04123 Dry eye syndrome of bilateral lacrimal glands: Secondary | ICD-10-CM | POA: Diagnosis not present

## 2018-02-26 DIAGNOSIS — H43813 Vitreous degeneration, bilateral: Secondary | ICD-10-CM | POA: Diagnosis not present

## 2018-02-26 DIAGNOSIS — H2511 Age-related nuclear cataract, right eye: Secondary | ICD-10-CM | POA: Diagnosis not present

## 2018-02-26 DIAGNOSIS — Z961 Presence of intraocular lens: Secondary | ICD-10-CM | POA: Diagnosis not present

## 2018-02-26 DIAGNOSIS — Z9889 Other specified postprocedural states: Secondary | ICD-10-CM | POA: Diagnosis not present

## 2018-03-11 DIAGNOSIS — R69 Illness, unspecified: Secondary | ICD-10-CM | POA: Diagnosis not present

## 2018-09-30 DIAGNOSIS — Z23 Encounter for immunization: Secondary | ICD-10-CM | POA: Diagnosis not present

## 2018-12-25 DIAGNOSIS — M1990 Unspecified osteoarthritis, unspecified site: Secondary | ICD-10-CM | POA: Diagnosis not present

## 2018-12-25 DIAGNOSIS — R69 Illness, unspecified: Secondary | ICD-10-CM | POA: Diagnosis not present

## 2018-12-25 DIAGNOSIS — G4730 Sleep apnea, unspecified: Secondary | ICD-10-CM | POA: Diagnosis not present

## 2018-12-25 DIAGNOSIS — Z6828 Body mass index (BMI) 28.0-28.9, adult: Secondary | ICD-10-CM | POA: Diagnosis not present

## 2018-12-25 DIAGNOSIS — K580 Irritable bowel syndrome with diarrhea: Secondary | ICD-10-CM | POA: Diagnosis not present

## 2018-12-25 DIAGNOSIS — Z87891 Personal history of nicotine dependence: Secondary | ICD-10-CM | POA: Diagnosis not present

## 2018-12-25 DIAGNOSIS — Z8249 Family history of ischemic heart disease and other diseases of the circulatory system: Secondary | ICD-10-CM | POA: Diagnosis not present

## 2018-12-25 DIAGNOSIS — Z85828 Personal history of other malignant neoplasm of skin: Secondary | ICD-10-CM | POA: Diagnosis not present

## 2018-12-25 DIAGNOSIS — Z008 Encounter for other general examination: Secondary | ICD-10-CM | POA: Diagnosis not present

## 2018-12-25 DIAGNOSIS — E663 Overweight: Secondary | ICD-10-CM | POA: Diagnosis not present

## 2018-12-25 DIAGNOSIS — G8929 Other chronic pain: Secondary | ICD-10-CM | POA: Diagnosis not present

## 2019-02-17 DIAGNOSIS — M5412 Radiculopathy, cervical region: Secondary | ICD-10-CM | POA: Diagnosis not present

## 2019-02-17 DIAGNOSIS — G479 Sleep disorder, unspecified: Secondary | ICD-10-CM | POA: Diagnosis not present

## 2019-02-17 DIAGNOSIS — M7912 Myalgia of auxiliary muscles, head and neck: Secondary | ICD-10-CM | POA: Diagnosis not present

## 2019-02-17 DIAGNOSIS — H9313 Tinnitus, bilateral: Secondary | ICD-10-CM | POA: Diagnosis not present

## 2019-02-17 DIAGNOSIS — M5413 Radiculopathy, cervicothoracic region: Secondary | ICD-10-CM | POA: Diagnosis not present

## 2019-02-17 DIAGNOSIS — M5414 Radiculopathy, thoracic region: Secondary | ICD-10-CM | POA: Diagnosis not present

## 2019-02-17 DIAGNOSIS — M5415 Radiculopathy, thoracolumbar region: Secondary | ICD-10-CM | POA: Diagnosis not present

## 2019-02-17 DIAGNOSIS — M5417 Radiculopathy, lumbosacral region: Secondary | ICD-10-CM | POA: Diagnosis not present

## 2019-02-17 DIAGNOSIS — R519 Headache, unspecified: Secondary | ICD-10-CM | POA: Diagnosis not present

## 2019-02-17 DIAGNOSIS — M546 Pain in thoracic spine: Secondary | ICD-10-CM | POA: Diagnosis not present

## 2019-03-06 ENCOUNTER — Ambulatory Visit: Payer: Medicare HMO | Attending: Internal Medicine

## 2019-03-06 DIAGNOSIS — Z23 Encounter for immunization: Secondary | ICD-10-CM

## 2019-03-06 NOTE — Progress Notes (Signed)
   Covid-19 Vaccination Clinic  Name:  Penny Lamb    MRN: [id number] DOB: 1945/07/05  03/06/2019  [name] was observed post Covid-19 immunization for 15 minutes without incidence. She was provided with Vaccine Information Sheet and instruction to access the V-Safe system.   [name] was instructed to call 911 with any severe reactions post vaccine: Marland Kitchen Difficulty breathing  . Swelling of your face and throat  . A fast heartbeat  . A bad rash all over your body  . Dizziness and weakness    Immunizations Administered    Name Date Dose VIS Date Route   Pfizer COVID-19 Vaccine 03/06/2019  5:58 PM 0.3 mL 01/02/2019 Intramuscular   Manufacturer: [place]   Lot: [id number]   [place]: [id number]

## 2019-03-11 DIAGNOSIS — J45901 Unspecified asthma with (acute) exacerbation: Secondary | ICD-10-CM | POA: Diagnosis not present

## 2019-03-29 ENCOUNTER — Ambulatory Visit: Payer: Medicare HMO | Attending: Internal Medicine

## 2019-03-29 DIAGNOSIS — Z23 Encounter for immunization: Secondary | ICD-10-CM | POA: Insufficient documentation

## 2019-03-29 NOTE — Progress Notes (Signed)
   Covid-19 Vaccination Clinic  Name:  Penny Lamb    MRN: [id number] DOB: 23-Apr-1945  03/29/2019  [name] was observed post Covid-19 immunization for 15 minutes without incident. She was provided with Vaccine Information Sheet and instruction to access the V-Safe system.   [name] was instructed to call 911 with any severe reactions post vaccine: Marland Kitchen Difficulty breathing  . Swelling of face and throat  . A fast heartbeat  . A bad rash all over body  . Dizziness and weakness   Immunizations Administered    Name Date Dose VIS Date Route   Pfizer COVID-19 Vaccine 03/29/2019  1:06 PM 0.3 mL 01/02/2019 Intramuscular   Manufacturer: [place]   Lot: [id number]   [place]: [id number]

## 2019-04-06 DIAGNOSIS — M7912 Myalgia of auxiliary muscles, head and neck: Secondary | ICD-10-CM | POA: Diagnosis not present

## 2019-04-06 DIAGNOSIS — M5414 Radiculopathy, thoracic region: Secondary | ICD-10-CM | POA: Diagnosis not present

## 2019-04-06 DIAGNOSIS — R519 Headache, unspecified: Secondary | ICD-10-CM | POA: Diagnosis not present

## 2019-04-06 DIAGNOSIS — H9313 Tinnitus, bilateral: Secondary | ICD-10-CM | POA: Diagnosis not present

## 2019-04-06 DIAGNOSIS — M546 Pain in thoracic spine: Secondary | ICD-10-CM | POA: Diagnosis not present

## 2019-04-06 DIAGNOSIS — M5417 Radiculopathy, lumbosacral region: Secondary | ICD-10-CM | POA: Diagnosis not present

## 2019-04-06 DIAGNOSIS — G479 Sleep disorder, unspecified: Secondary | ICD-10-CM | POA: Diagnosis not present

## 2019-04-06 DIAGNOSIS — M5412 Radiculopathy, cervical region: Secondary | ICD-10-CM | POA: Diagnosis not present

## 2019-04-06 DIAGNOSIS — M5415 Radiculopathy, thoracolumbar region: Secondary | ICD-10-CM | POA: Diagnosis not present

## 2019-04-06 DIAGNOSIS — M5413 Radiculopathy, cervicothoracic region: Secondary | ICD-10-CM | POA: Diagnosis not present

## 2019-05-20 DIAGNOSIS — R110 Nausea: Secondary | ICD-10-CM | POA: Diagnosis not present

## 2019-05-20 DIAGNOSIS — R509 Fever, unspecified: Secondary | ICD-10-CM | POA: Diagnosis not present

## 2019-05-20 DIAGNOSIS — R300 Dysuria: Secondary | ICD-10-CM | POA: Diagnosis not present

## 2019-05-21 DIAGNOSIS — R399 Unspecified symptoms and signs involving the genitourinary system: Secondary | ICD-10-CM | POA: Diagnosis not present

## 2019-05-21 DIAGNOSIS — B349 Viral infection, unspecified: Secondary | ICD-10-CM | POA: Diagnosis not present

## 2019-05-26 DIAGNOSIS — M1990 Unspecified osteoarthritis, unspecified site: Secondary | ICD-10-CM | POA: Diagnosis not present

## 2019-05-26 DIAGNOSIS — K589 Irritable bowel syndrome without diarrhea: Secondary | ICD-10-CM | POA: Diagnosis not present

## 2019-05-26 DIAGNOSIS — Z7951 Long term (current) use of inhaled steroids: Secondary | ICD-10-CM | POA: Diagnosis not present

## 2019-05-26 DIAGNOSIS — R69 Illness, unspecified: Secondary | ICD-10-CM | POA: Diagnosis not present

## 2019-05-26 DIAGNOSIS — R030 Elevated blood-pressure reading, without diagnosis of hypertension: Secondary | ICD-10-CM | POA: Diagnosis not present

## 2019-05-26 DIAGNOSIS — Z809 Family history of malignant neoplasm, unspecified: Secondary | ICD-10-CM | POA: Diagnosis not present

## 2019-05-26 DIAGNOSIS — J449 Chronic obstructive pulmonary disease, unspecified: Secondary | ICD-10-CM | POA: Diagnosis not present

## 2019-05-26 DIAGNOSIS — G8929 Other chronic pain: Secondary | ICD-10-CM | POA: Diagnosis not present

## 2019-05-26 DIAGNOSIS — E669 Obesity, unspecified: Secondary | ICD-10-CM | POA: Diagnosis not present

## 2019-05-26 DIAGNOSIS — I739 Peripheral vascular disease, unspecified: Secondary | ICD-10-CM | POA: Diagnosis not present

## 2019-09-15 DIAGNOSIS — M25559 Pain in unspecified hip: Secondary | ICD-10-CM | POA: Diagnosis not present

## 2019-09-15 DIAGNOSIS — Z6829 Body mass index (BMI) 29.0-29.9, adult: Secondary | ICD-10-CM | POA: Diagnosis not present

## 2019-09-15 DIAGNOSIS — M1611 Unilateral primary osteoarthritis, right hip: Secondary | ICD-10-CM | POA: Diagnosis not present

## 2019-09-16 DIAGNOSIS — Z7189 Other specified counseling: Secondary | ICD-10-CM | POA: Diagnosis not present

## 2019-09-30 DIAGNOSIS — R69 Illness, unspecified: Secondary | ICD-10-CM | POA: Diagnosis not present

## 2019-10-15 DIAGNOSIS — M7061 Trochanteric bursitis, right hip: Secondary | ICD-10-CM | POA: Diagnosis not present

## 2019-10-15 DIAGNOSIS — M7062 Trochanteric bursitis, left hip: Secondary | ICD-10-CM | POA: Diagnosis not present

## 2019-10-15 DIAGNOSIS — M25551 Pain in right hip: Secondary | ICD-10-CM | POA: Diagnosis not present

## 2019-10-27 DIAGNOSIS — L258 Unspecified contact dermatitis due to other agents: Secondary | ICD-10-CM | POA: Diagnosis not present

## 2019-10-27 DIAGNOSIS — I788 Other diseases of capillaries: Secondary | ICD-10-CM | POA: Diagnosis not present

## 2019-10-27 DIAGNOSIS — L821 Other seborrheic keratosis: Secondary | ICD-10-CM | POA: Diagnosis not present

## 2019-10-27 DIAGNOSIS — L738 Other specified follicular disorders: Secondary | ICD-10-CM | POA: Diagnosis not present

## 2019-11-07 DIAGNOSIS — M25551 Pain in right hip: Secondary | ICD-10-CM | POA: Diagnosis not present

## 2019-11-27 DIAGNOSIS — R3989 Other symptoms and signs involving the genitourinary system: Secondary | ICD-10-CM | POA: Diagnosis not present

## 2019-11-27 DIAGNOSIS — R509 Fever, unspecified: Secondary | ICD-10-CM | POA: Diagnosis not present

## 2019-11-30 DIAGNOSIS — R509 Fever, unspecified: Secondary | ICD-10-CM | POA: Diagnosis not present

## 2020-03-10 DIAGNOSIS — Z1389 Encounter for screening for other disorder: Secondary | ICD-10-CM | POA: Diagnosis not present

## 2020-03-10 DIAGNOSIS — Z0000 Encounter for general adult medical examination without abnormal findings: Secondary | ICD-10-CM | POA: Diagnosis not present

## 2020-04-18 DIAGNOSIS — Z87891 Personal history of nicotine dependence: Secondary | ICD-10-CM | POA: Diagnosis not present

## 2020-04-18 DIAGNOSIS — J45909 Unspecified asthma, uncomplicated: Secondary | ICD-10-CM | POA: Diagnosis not present

## 2020-04-18 DIAGNOSIS — Z1211 Encounter for screening for malignant neoplasm of colon: Secondary | ICD-10-CM | POA: Diagnosis not present

## 2020-04-18 DIAGNOSIS — K589 Irritable bowel syndrome without diarrhea: Secondary | ICD-10-CM | POA: Diagnosis not present

## 2020-04-21 DIAGNOSIS — Z1211 Encounter for screening for malignant neoplasm of colon: Secondary | ICD-10-CM | POA: Diagnosis not present

## 2020-07-11 IMAGING — MG DIGITAL SCREENING BILATERAL MAMMOGRAM WITH TOMO AND CAD
8 series · 8 of 24 positions shown · non-contrast
Comparison: Previous exam(s).

CLINICAL DATA: Screening.

EXAM:
DIGITAL SCREENING BILATERAL MAMMOGRAM WITH TOMO AND CAD

[R MLO synth-2D]
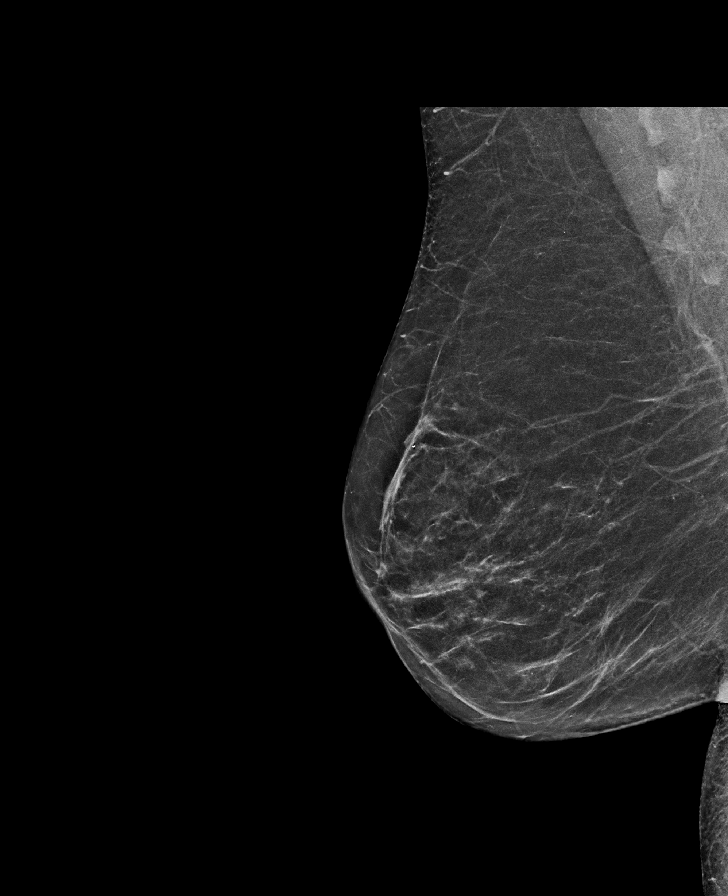

[L CC synth-2D]
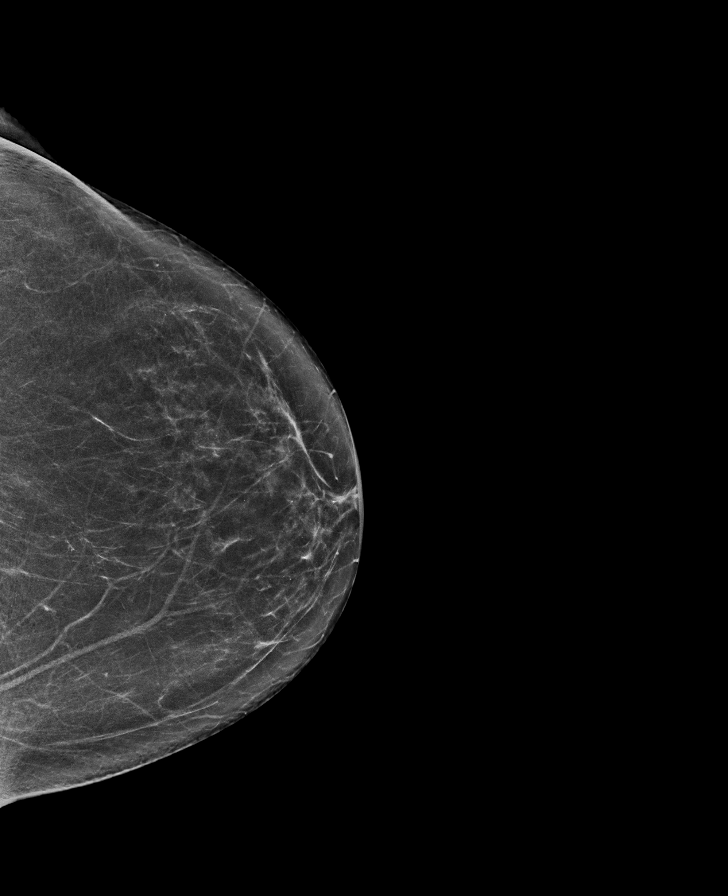

[R CC synth-2D]
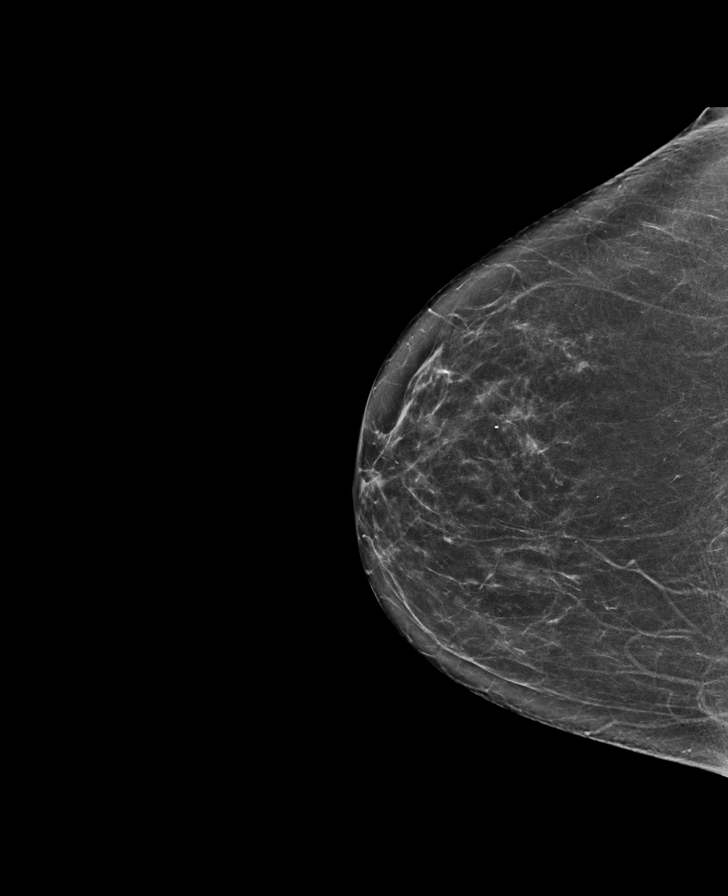

[L MLO synth-2D]
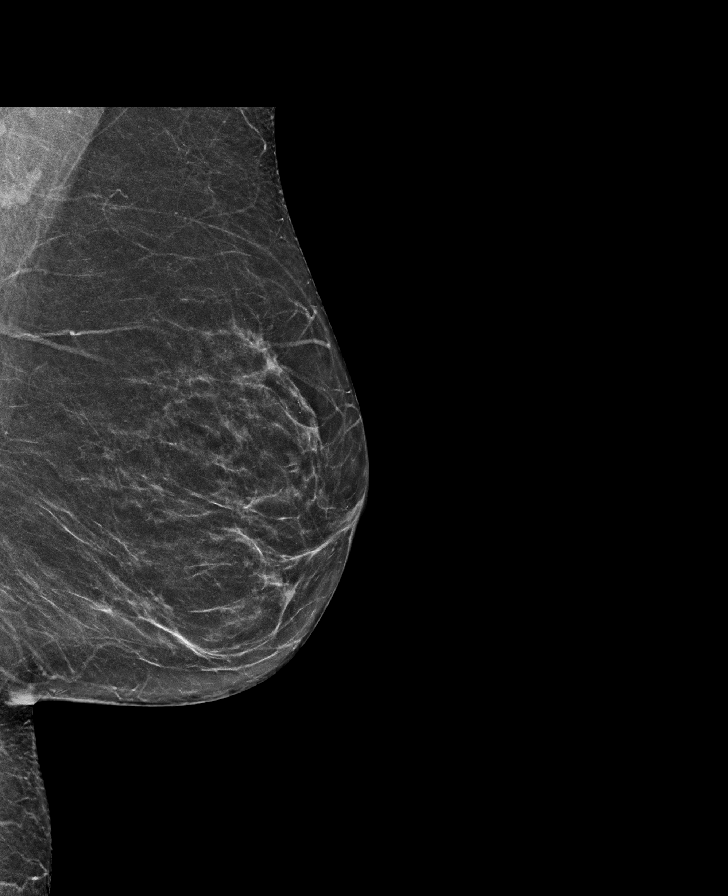

[R MLO tomo · tomo slice 35/68.0]
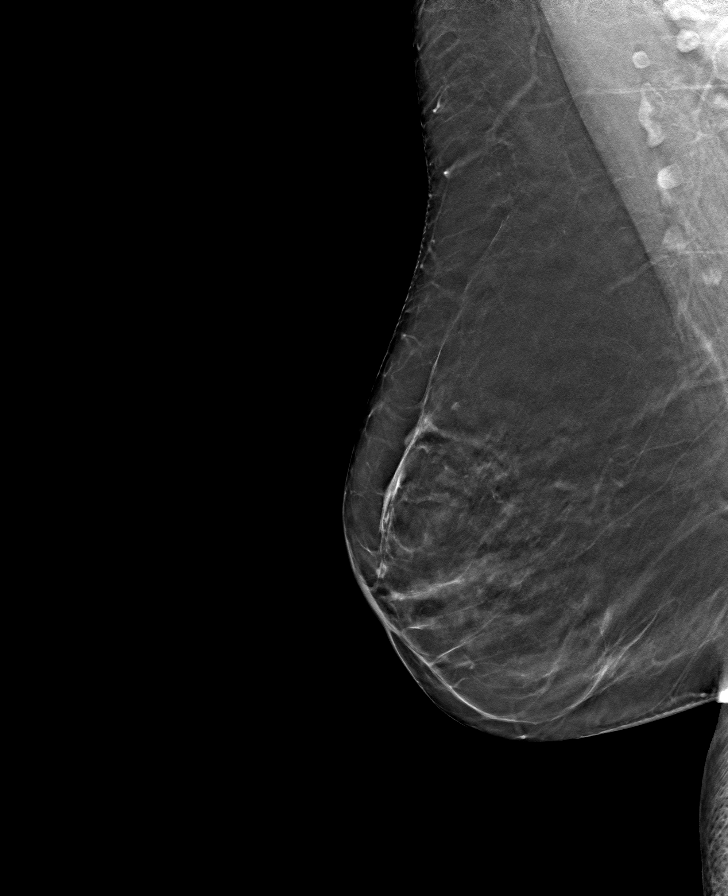

[R CC tomo · tomo slice 33/66.0]
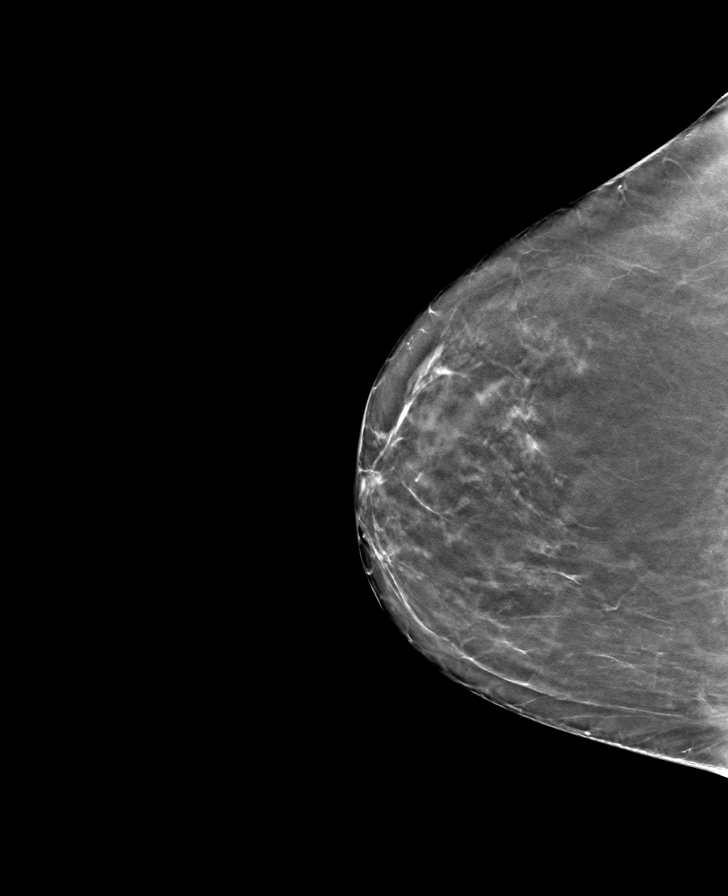

[L CC tomo · tomo slice 35/68.0]
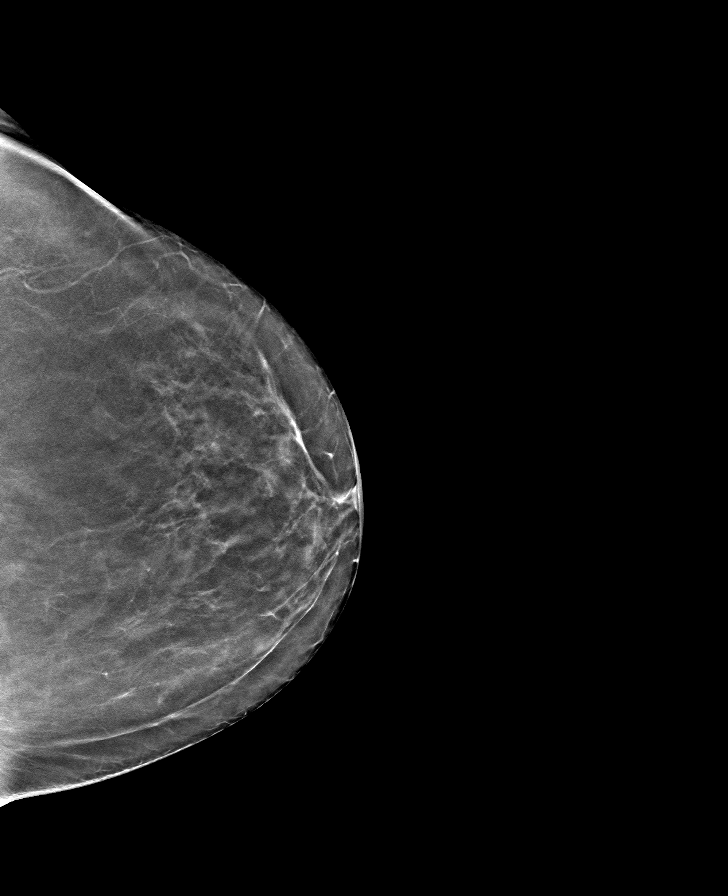

[L MLO tomo · tomo slice 32/63.0]
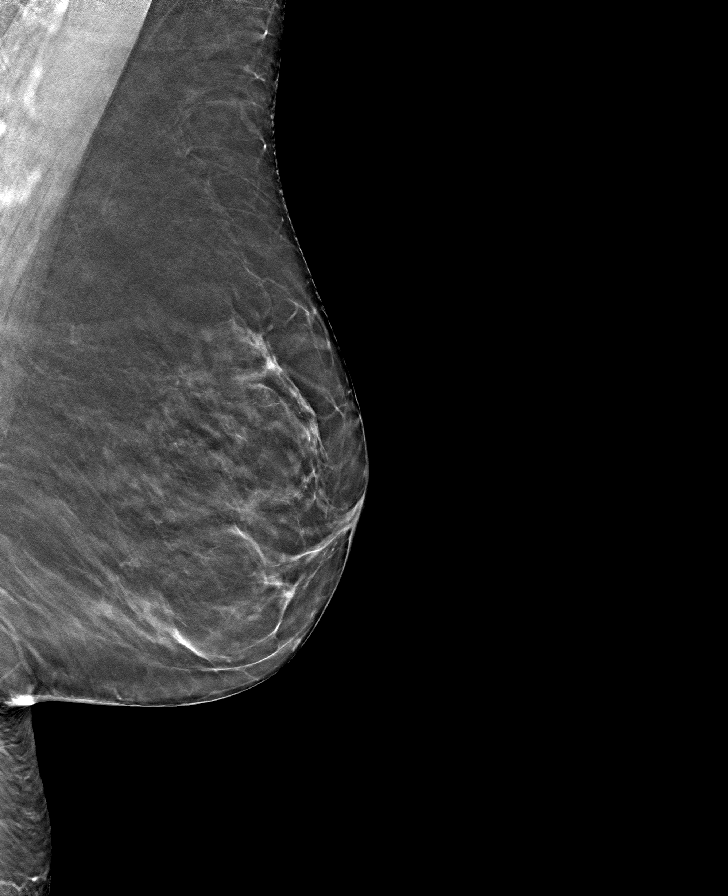

[8 of 24 positions shown; findings below may reference images not displayed]

ACR Breast Density Category b: There are scattered areas of
fibroglandular density.
FINDINGS: There are no findings suspicious for malignancy. Images were
processed with CAD.
IMPRESSION: No mammographic evidence of malignancy. A result letter of this
screening mammogram will be mailed directly to the patient.

RECOMMENDATION:
Screening mammogram in one year. (Code:[id number])

BI-RADS CATEGORY  1: Negative.

## 2020-07-28 DIAGNOSIS — B351 Tinea unguium: Secondary | ICD-10-CM | POA: Diagnosis not present

## 2020-07-28 DIAGNOSIS — L309 Dermatitis, unspecified: Secondary | ICD-10-CM | POA: Diagnosis not present

## 2020-10-03 DIAGNOSIS — L820 Inflamed seborrheic keratosis: Secondary | ICD-10-CM | POA: Diagnosis not present

## 2020-10-03 DIAGNOSIS — L578 Other skin changes due to chronic exposure to nonionizing radiation: Secondary | ICD-10-CM | POA: Diagnosis not present

## 2021-02-16 ENCOUNTER — Emergency Department (HOSPITAL_BASED_OUTPATIENT_CLINIC_OR_DEPARTMENT_OTHER): Payer: Medicare Other

## 2021-02-16 ENCOUNTER — Other Ambulatory Visit: Payer: Self-pay

## 2021-02-16 ENCOUNTER — Encounter (HOSPITAL_BASED_OUTPATIENT_CLINIC_OR_DEPARTMENT_OTHER): Payer: Self-pay

## 2021-02-16 ENCOUNTER — Emergency Department (HOSPITAL_BASED_OUTPATIENT_CLINIC_OR_DEPARTMENT_OTHER)
Admission: EM | Admit: 2021-02-16 | Discharge: 2021-02-16 | Disposition: A | Discharge status: Home / Self Care | Payer: Medicare Other | Attending: Emergency Medicine | Admitting: Emergency Medicine

## 2021-02-16 DIAGNOSIS — I6529 Occlusion and stenosis of unspecified carotid artery: Secondary | ICD-10-CM | POA: Diagnosis not present

## 2021-02-16 DIAGNOSIS — I10 Essential (primary) hypertension: Secondary | ICD-10-CM | POA: Diagnosis not present

## 2021-02-16 DIAGNOSIS — R4020 Unspecified coma: Secondary | ICD-10-CM | POA: Diagnosis not present

## 2021-02-16 DIAGNOSIS — I720 Aneurysm of carotid artery: Secondary | ICD-10-CM | POA: Diagnosis not present

## 2021-02-16 DIAGNOSIS — R42 Dizziness and giddiness: Secondary | ICD-10-CM | POA: Diagnosis not present

## 2021-02-16 DIAGNOSIS — S199XXA Unspecified injury of neck, initial encounter: Secondary | ICD-10-CM | POA: Diagnosis not present

## 2021-02-16 DIAGNOSIS — I6523 Occlusion and stenosis of bilateral carotid arteries: Secondary | ICD-10-CM | POA: Diagnosis not present

## 2021-02-16 DIAGNOSIS — R55 Syncope and collapse: Secondary | ICD-10-CM

## 2021-02-16 DIAGNOSIS — M47812 Spondylosis without myelopathy or radiculopathy, cervical region: Secondary | ICD-10-CM | POA: Diagnosis not present

## 2021-02-16 DIAGNOSIS — S0990XA Unspecified injury of head, initial encounter: Secondary | ICD-10-CM | POA: Diagnosis not present

## 2021-02-16 LAB — COMPREHENSIVE METABOLIC PANEL
ALT: 23 U/L (ref 0–44)
AST: 27 U/L (ref 15–41)
Albumin: 4.4 g/dL (ref 3.5–5.0)
Alkaline Phosphatase: 71 U/L (ref 38–126)
Anion gap: 12 (ref 5–15)
BUN: 16 mg/dL (ref 8–23)
CO2: 26 mmol/L (ref 22–32)
Calcium: 9 mg/dL (ref 8.9–10.3)
Chloride: 101 mmol/L (ref 98–111)
Creatinine, Ser: 0.82 mg/dL (ref 0.44–1.00)
GFR, Estimated: >60 mL/min (ref >60)
Glucose, Bld: 94 mg/dL (ref 70–99)
Potassium: 3.6 mmol/L (ref 3.5–5.1)
Sodium: 139 mmol/L (ref 135–145)
Total Bilirubin: 0.7 mg/dL (ref 0.3–1.2)
Total Protein: 7.3 g/dL (ref 6.5–8.1)

## 2021-02-16 LAB — CBC WITH DIFFERENTIAL/PLATELET
Abs Immature Granulocytes: 0.02 10*3/uL (ref 0.00–0.07)
Basophils Absolute: 0.1 10*3/uL (ref 0.0–0.1)
Basophils Relative: 1 %
Eosinophils Absolute: 0.2 10*3/uL (ref 0.0–0.5)
Eosinophils Relative: 2 %
HCT: 43.1 % (ref 36.0–46.0)
Hemoglobin: 14.6 g/dL (ref 12.0–15.0)
Immature Granulocytes: 0 %
Lymphocytes Relative: 36 %
Lymphs Abs: 2.5 10*3/uL (ref 0.7–4.0)
MCH: 31.4 pg (ref 26.0–34.0)
MCHC: 33.9 g/dL (ref 30.0–36.0)
MCV: 92.7 fL (ref 80.0–100.0)
Monocytes Absolute: 0.5 10*3/uL (ref 0.1–1.0)
Monocytes Relative: 7 %
Neutro Abs: 3.7 10*3/uL (ref 1.7–7.7)
Neutrophils Relative %: 54 %
Platelets: 284 10*3/uL (ref 150–400)
RBC: 4.65 MIL/uL (ref 3.87–5.11)
RDW: 14 % (ref 11.5–15.5)
WBC: 7 10*3/uL (ref 4.0–10.5)
nRBC: 0 % (ref 0.0–0.2)

## 2021-02-16 LAB — TROPONIN I (HIGH SENSITIVITY)
Troponin I (High Sensitivity): 4 ng/L (ref <18)
Troponin I (High Sensitivity): 5 ng/L (ref <18)

## 2021-02-16 LAB — URINALYSIS, ROUTINE W REFLEX MICROSCOPIC
Bilirubin Urine: NEGATIVE
Glucose, UA: NEGATIVE mg/dL
Hgb urine dipstick: NEGATIVE
Ketones, ur: NEGATIVE mg/dL
Nitrite: NEGATIVE
Protein, ur: NEGATIVE mg/dL
Specific Gravity, Urine: 1.02 (ref 1.005–1.030)
pH: 5.5 (ref 5.0–8.0)

## 2021-02-16 LAB — URINALYSIS, MICROSCOPIC (REFLEX): RBC / HPF: NONE SEEN RBC/hpf (ref 0–5)

## 2021-02-16 MED ORDER — IOHEXOL 350 MG/ML SOLN
75.0000 mL | Freq: Once | INTRAVENOUS | Status: AC | PRN
  Administered 2021-02-16: 75 mL via INTRAVENOUS

## 2021-02-16 NOTE — ED Provider Notes (Signed)
[facility] Provider Note   CSN: [id number] Arrival date & time: 02/16/21  1415     History  Chief Complaint  Patient presents with   Loss of Consciousness    Penny Lamb is a 76 y.o. female COPD, osteoarthritis, PVD, IBS.  Presents to the emergency department with a chief complaint of syncopal episode.  Patient reports that this morning approximately 10-10 30 she tilted her head back to drink a glass of water when she started feeling lightheaded.  Patient states that her legs collapsed from underneath her and she was able to reach out to the stove and slowly fell to the floor.  Patient endorses brief loss of consciousness and believes that she hit her head.  Patient denies any preceding headache, chest pain, or shortness of breath.  Patient has not had any episodes of lightheadedness or further episodes of syncope since this incident.  Patient complains of pain to left trapezius muscle since her fall.  Patient denies any fever, chills, numbness, weakness, saddle anesthesia, bowel or bladder dysfunction, visual disturbance, seizure, facial asymmetry, dysarthria.  Patient is not on any blood thinners.   Loss of Consciousness Associated symptoms: no chest pain, no confusion, no dizziness, no fever, no headaches, no nausea, no seizures, no shortness of breath, no vomiting and no weakness       Home Medications Prior to Admission medications   Medication Sig Start Date End Date Taking? Authorizing Provider  budesonide-formoterol (SYMBICORT) 80-4.5 MCG/ACT inhaler Inhale 2 puffs into the lungs every 12 (twelve) hours. Patient not taking: Reported on 08/22/2017 03/11/17   Tanda Rockers, MD  Calcium Carbonate-Vitamin D (CALCIUM-VITAMIN D) 600-200 MG-UNIT CAPS Take by mouth 2 (two) times daily.      [provider]  Multiple Vitamins-Minerals (HAIR/SKIN/NAILS) TABS Take 1 tablet by mouth daily.    [provider]  ondansetron (ZOFRAN  ODT) 4 MG disintegrating tablet Take 1 tablet (4 mg total) by mouth every 8 (eight) hours as needed for nausea or vomiting. 08/22/17   Glyn Ade, PA-C  traMADol (ULTRAM) 50 MG tablet Take 50 mg by mouth daily. 08/21/17   [provider]  vitamin E 1000 UNIT capsule Take 1,000 Units by mouth daily.    [provider]      Allergies    Bactrim, Belladonna alkaloids, Codeine, Hydrocodone, Ibuprofen, Macrobid [nitrofurantoin macrocrystal], Macrolides and ketolides, Prednisone, Sulfa antibiotics, and Tramadol    Review of Systems   Review of Systems  Constitutional:  Negative for chills and fever.  HENT:  Negative for facial swelling.   Eyes:  Negative for visual disturbance.  Respiratory:  Negative for shortness of breath.   Cardiovascular:  Positive for syncope. Negative for chest pain.  Gastrointestinal:  Negative for abdominal pain, blood in stool, nausea and vomiting.  Genitourinary:  Negative for difficulty urinating, dysuria, flank pain, frequency, hematuria, pelvic pain, urgency, vaginal bleeding, vaginal discharge and vaginal pain.  Musculoskeletal:  Negative for back pain and neck pain.  Skin:  Negative for color change and rash.  Neurological:  Positive for syncope and light-headedness. Negative for dizziness, tremors, seizures, facial asymmetry, speech difficulty, weakness, numbness and headaches.  Psychiatric/Behavioral:  Negative for confusion.    Physical Exam Updated Vital Signs BP (!) 180/81 (BP Location: Left Arm)    Pulse 69    Temp 97.8 F (36.6 C) (Oral)    Resp 18    Ht 5' (1.524 m)    Wt 68.4 kg  SpO2 100%    BMI 29.45 kg/m  Physical Exam Vitals and nursing note reviewed.  Constitutional:      General: She is not in acute distress.    Appearance: She is not ill-appearing, toxic-appearing or diaphoretic.  HENT:     Head: Normocephalic and atraumatic.     Jaw: No trismus or pain on movement.  Eyes:     General: No scleral icterus.        Right eye: No discharge.        Left eye: No discharge.     Extraocular Movements: Extraocular movements intact.     Conjunctiva/sclera: Conjunctivae normal.     Pupils: Pupils are equal, round, and reactive to light.  Neck:     Comments: Tenderness to left trapezius muscle Cardiovascular:     Rate and Rhythm: Normal rate.     Pulses:          Radial pulses are 2+ on the right side and 2+ on the left side.     Heart sounds: Normal heart sounds, S1 normal and S2 normal.  Pulmonary:     Effort: Pulmonary effort is normal. No tachypnea, bradypnea or respiratory distress.     Breath sounds: Normal breath sounds. No stridor.  Abdominal:     Palpations: Abdomen is soft.     Tenderness: There is no abdominal tenderness.  Musculoskeletal:     Cervical back: Normal range of motion and neck supple. Tenderness present. No swelling, edema, deformity, erythema, signs of trauma, lacerations, rigidity, spasms, torticollis, bony tenderness or crepitus. Muscular tenderness present. No pain with movement or spinous process tenderness. Normal range of motion.     Thoracic back: No swelling, edema, deformity, signs of trauma, lacerations, spasms, tenderness or bony tenderness.     Lumbar back: No swelling, edema, deformity, signs of trauma, lacerations, spasms, tenderness or bony tenderness.     Comments: No midline tenderness or deformity to cervical, thoracic, or lumbar spine.  No tenderness, point tenderness, or deformities of bilateral upper or lower extremities.  Skin:    General: Skin is warm and dry.  Neurological:     General: No focal deficit present.     Mental Status: She is alert and oriented to person, place, and time.     GCS: GCS eye subscore is 4. GCS verbal subscore is 5. GCS motor subscore is 6.     Cranial Nerves: No cranial nerve deficit or facial asymmetry.     Sensory: Sensation is intact.     Motor: No weakness, tremor, seizure activity or pronator drift.     Coordination: Romberg  sign negative. Finger-Nose-Finger Test normal.     Gait: Gait is intact. Gait normal.     Comments: CN II-XII intact, equal grip strength, +5 strength to bilateral upper and lower extremities, sensation to light touch grossly intact to bilateral upper and lower extremities.  No lightheadedness upon standing  Psychiatric:        Behavior: Behavior is cooperative.    ED Results / Procedures / Treatments   Labs (all labs ordered are listed, but only abnormal results are displayed) Labs Reviewed  URINALYSIS, ROUTINE W REFLEX MICROSCOPIC - Abnormal; Notable for the following components:      Result Value   Leukocytes,Ua SMALL (*)    All other components within normal limits  URINALYSIS, MICROSCOPIC (REFLEX) - Abnormal; Notable for the following components:   Bacteria, UA RARE (*)    All other components within normal limits  COMPREHENSIVE  METABOLIC PANEL  CBC WITH DIFFERENTIAL/PLATELET  TROPONIN I (HIGH SENSITIVITY)  TROPONIN I (HIGH SENSITIVITY)    EKG EKG Interpretation  Date/Time:  Thursday February 16 2021 14:50:54 EST Ventricular Rate:  63 PR Interval:  159 QRS Duration: 98 QT Interval:  408 QTC Calculation: 418 R Axis:   81 Text Interpretation: Sinus rhythm Right atrial enlargement Borderline right axis deviation Confirmed by Campbell Stall ([id number]) on 07/18/9483 3:01:00 PM  Radiology CT ANGIO HEAD NECK W WO CM  Result Date: 02/16/2021 CLINICAL DATA:  Dizziness.  Syncope. EXAM: CT ANGIOGRAPHY HEAD AND NECK TECHNIQUE: Multidetector CT imaging of the head and neck was performed using the standard protocol during bolus administration of intravenous contrast. Multiplanar CT image reconstructions and MIPs were obtained to evaluate the vascular anatomy. Carotid stenosis measurements (when applicable) are obtained utilizing NASCET criteria, using the distal internal carotid diameter as the denominator. RADIATION DOSE REDUCTION: This exam was performed according to the departmental  dose-optimization program which includes automated exposure control, adjustment of the mA and/or kV according to patient size and/or use of iterative reconstruction technique. CONTRAST:  5mL OMNIPAQUE IOHEXOL 350 MG/ML SOLN COMPARISON:  None. FINDINGS: CTA NECK FINDINGS Aortic arch: Standard 3 vessel aortic arch with widely patent arch vessel origins. Right carotid system: Patent without evidence of stenosis, dissection, or significant atherosclerosis. Left carotid system: Patent without evidence of stenosis, dissection, or significant atherosclerosis. Vertebral arteries: Patent without evidence of flow limiting stenosis, dissection, or significant atherosclerosis. Mild narrowing of the left vertebral artery at C3 due to degenerative cervical spine spurring. Mildly dominant right vertebral artery. Skeleton: Cervical spine evaluated in detail on today's earlier dedicated spine CT. No acute osseous abnormality or suspicious osseous lesion. Other neck: No evidence of cervical lymphadenopathy or mass. Upper chest: Clear lung apices. Review of the MIP images confirms the above findings CTA HEAD FINDINGS Anterior circulation: The internal carotid arteries are patent from skull base to carotid termini with mild atherosclerotic plaque bilaterally not resulting in significant stenosis. A superiorly projecting right paraophthalmic ICA aneurysm measures 2 mm. ACAs and MCAs are patent without evidence of a proximal branch occlusion or significant proximal stenosis. Posterior circulation: The intracranial vertebral arteries are widely patent to the basilar. The basilar artery is widely patent. There are small posterior communicating arteries, left larger than right. Both PCAs are patent without evidence of a significant proximal stenosis. No aneurysm is identified. Venous sinuses: Patent. Anatomic variants: None. Review of the MIP images confirms the above findings IMPRESSION: 1. Mild intracranial atherosclerosis without large  vessel occlusion or significant proximal stenosis. 2. 2 mm right paraophthalmic ICA aneurysm. 3. Widely patent cervical carotid and vertebral arteries. Electronically Signed   By: Logan Bores M.D.   On: 02/16/2021 17:18   CT Head Wo Contrast  Result Date: 02/16/2021 CLINICAL DATA:  Head trauma EXAM: CT HEAD WITHOUT CONTRAST CT CERVICAL SPINE WITHOUT CONTRAST TECHNIQUE: Multidetector CT imaging of the head and cervical spine was performed following the standard protocol without intravenous contrast. Multiplanar CT image reconstructions of the cervical spine were also generated. RADIATION DOSE REDUCTION: This exam was performed according to the departmental dose-optimization program which includes automated exposure control, adjustment of the mA and/or kV according to patient size and/or use of iterative reconstruction technique. COMPARISON:  None. FINDINGS: CT HEAD FINDINGS Brain: No evidence of acute infarction, hemorrhage, hydrocephalus, extra-axial collection or mass lesion/mass effect. Vascular: No hyperdense vessels.  Carotid vascular calcification Skull: Normal. Negative for fracture or focal lesion. Sinuses/Orbits: No acute finding. Other:  None CT CERVICAL SPINE FINDINGS Alignment: Straightening of the cervical spine. No subluxation. Facet alignment is maintained. Skull base and vertebrae: No acute fracture. No primary bone lesion or focal pathologic process. Corticated bony density at the tip of dens may reflect ossicle. Soft tissues and spinal canal: No prevertebral fluid or swelling. No visible canal hematoma. Disc levels: Multilevel degenerative change. Advanced disc space narrowing C4-C5, C5-C6 and C6-C7. Facet degenerative changes at multiple levels with foraminal stenosis. Upper chest: Apical scarring Other: None IMPRESSION: 1. No CT evidence for acute intracranial abnormality. 2. Straightening of the cervical spine with degenerative changes. No definite acute osseous abnormality is seen.  Electronically Signed   By: Donavan Foil M.D.   On: 02/16/2021 15:57   CT Cervical Spine Wo Contrast  Result Date: 02/16/2021 CLINICAL DATA:  Head trauma EXAM: CT HEAD WITHOUT CONTRAST CT CERVICAL SPINE WITHOUT CONTRAST TECHNIQUE: Multidetector CT imaging of the head and cervical spine was performed following the standard protocol without intravenous contrast. Multiplanar CT image reconstructions of the cervical spine were also generated. RADIATION DOSE REDUCTION: This exam was performed according to the departmental dose-optimization program which includes automated exposure control, adjustment of the mA and/or kV according to patient size and/or use of iterative reconstruction technique. COMPARISON:  None. FINDINGS: CT HEAD FINDINGS Brain: No evidence of acute infarction, hemorrhage, hydrocephalus, extra-axial collection or mass lesion/mass effect. Vascular: No hyperdense vessels.  Carotid vascular calcification Skull: Normal. Negative for fracture or focal lesion. Sinuses/Orbits: No acute finding. Other: None CT CERVICAL SPINE FINDINGS Alignment: Straightening of the cervical spine. No subluxation. Facet alignment is maintained. Skull base and vertebrae: No acute fracture. No primary bone lesion or focal pathologic process. Corticated bony density at the tip of dens may reflect ossicle. Soft tissues and spinal canal: No prevertebral fluid or swelling. No visible canal hematoma. Disc levels: Multilevel degenerative change. Advanced disc space narrowing C4-C5, C5-C6 and C6-C7. Facet degenerative changes at multiple levels with foraminal stenosis. Upper chest: Apical scarring Other: None IMPRESSION: 1. No CT evidence for acute intracranial abnormality. 2. Straightening of the cervical spine with degenerative changes. No definite acute osseous abnormality is seen. Electronically Signed   By: Donavan Foil M.D.   On: 02/16/2021 15:57    Procedures Procedures    Medications Ordered in ED Medications   iohexol (OMNIPAQUE) 350 MG/ML injection 75 mL (75 mLs Intravenous Contrast Given 02/16/21 1647)    ED Course/ Medical Decision Making/ A&P                           Medical Decision Making Amount and/or Complexity of Data Reviewed Labs: ordered. Radiology: ordered.  Risk Prescription drug management.   This patient presents to the ED for concern of syncope, this involves an extensive number of treatment options, and is a complaint that carries with it a high risk of complications and morbidity.     Co morbidities that complicate the patient evaluation  See HPI   Additional history obtained:  Additional history obtained from patient's daughter at bedside External records from outside source obtained and reviewed including previous provider notes   Lab Tests:  I Ordered, and personally interpreted labs.  The pertinent results include:   Troponin 4 and 5 with delta of +1 CMP, CBC unremarkable Urinalysis shows bacteria rare, WBC 6-10, leukocyte small, nitrite negative.   Imaging Studies ordered:  I ordered imaging studies including noncontrast head and cervical spine CT.  CTA head and neck. I  independently visualized and interpreted imaging which showed  No acute intracranial or cervical spine abnormality Mild intracranial arteriosclerosis without large vessel occlusion or significant proximal stenosis.  2 mm right paraophthalmic ICA aneurysm.  Widely patent cervical carotid and vertebral arteries. I agree with the radiologist interpretation   Cardiac Monitoring:  The patient was maintained on a cardiac monitor.  I personally viewed and interpreted the cardiac monitored which showed an underlying rhythm of: [place] rhythm with atrial enlargement  Problem List / ED Course:  Syncope Neuro exam is reassuring Did patient syncope CMP, CBC, troponin, EKG were ordered.  Due to patient hitting her head with complaints of neck pain will obtain noncontrast head and cervical  spine CT. EKG shows sinus rhythm, negative troponins. Contrast head CT shows no acute intracranial abnormality. Ordered CTA head and neck due to concerns for dissection versus stenosis.  Findings as above.  Aneurysm noted as incidental finding.  We will have patient follow-up with neurosurgery in outpatient setting Patient was noted to be hypertensive on the emergency department.  We will have her follow-up with PCP for repeat evaluation in outpatient setting. \Patient has no episodes of syncope or lightheadedness while in the emergency department.  Patient was discussed with and evaluated by Dr. Darl Householder.    Reevaluation:  After the interventions noted above, I reevaluated the patient and found that they have :stayed the same   Disposition:  After consideration of the diagnostic results and the patients response to treatment, I feel that the patent would benefit from discharge and follow-up with PCP and [place] [facility].          Final Clinical Impression(s) / ED Diagnoses Final diagnoses:  Syncope, unspecified syncope type  Hypertension, unspecified type    Rx / DC Orders ED Discharge Orders     None         Dyann Ruddle 02/16/21 2239    Drenda Freeze, MD 02/16/21 2244

## 2021-02-16 NOTE — Discharge Instructions (Addendum)
You came to the emergency department today to be evaluated for your episode of passing out.  Your physical exam and lab work were reassuring.  While in the emergency department you are found to have an elevated blood pressure.  Please follow-up with your primary care provider in the outpatient setting for repeat assessment of your blood pressure.  The CT scan of your head showed that you have a 2 mm right paraophthalmic ICA aneurysm.  Due to this you will need to follow-up with [place] [facility] in the outpatient setting.  Please call to schedule a follow-up appointment.  Get help right away if: You faint. You hit your head or are injured after fainting. You have any of these symptoms that may indicate trouble with your heart: Fast or irregular heartbeats (palpitations). Unusual pain in your chest, abdomen, or back. Shortness of breath. You have a seizure. You have a severe headache. You are confused. You have vision problems. You have severe weakness or trouble walking. You are bleeding from your mouth or rectum, or you have black or tarry stool.

## 2021-02-16 NOTE — ED Notes (Signed)
DELAY IN TRIAGE DUE TO PT UNSURE IF SHE IS HERE FOR OUT PT CT SCAN VS ED PT-DAUGHTER CALLED PCP WALK IN CLINIC-PT AND DAUGHTER DECIDED TO HAVE PT SEEN IN ED INSTEAD OF CONTINUING TO WAIT FOR RETURN CALL FROM PCP OFFICE

## 2021-02-16 NOTE — ED Triage Notes (Signed)
Pt reports syncopal episode ~1030am-states she may have hit the right side of her head-NAD-steady gait

## 2021-02-22 DIAGNOSIS — I671 Cerebral aneurysm, nonruptured: Secondary | ICD-10-CM | POA: Diagnosis not present

## 2021-02-22 DIAGNOSIS — Z6829 Body mass index (BMI) 29.0-29.9, adult: Secondary | ICD-10-CM | POA: Diagnosis not present

## 2021-02-22 DIAGNOSIS — R030 Elevated blood-pressure reading, without diagnosis of hypertension: Secondary | ICD-10-CM | POA: Diagnosis not present

## 2021-02-27 DIAGNOSIS — R55 Syncope and collapse: Secondary | ICD-10-CM | POA: Diagnosis not present

## 2021-03-13 DIAGNOSIS — Z0000 Encounter for general adult medical examination without abnormal findings: Secondary | ICD-10-CM | POA: Diagnosis not present

## 2021-03-13 DIAGNOSIS — Z1389 Encounter for screening for other disorder: Secondary | ICD-10-CM | POA: Diagnosis not present

## 2021-03-14 ENCOUNTER — Other Ambulatory Visit: Payer: Self-pay | Admitting: Family Medicine

## 2021-03-14 DIAGNOSIS — E2839 Other primary ovarian failure: Secondary | ICD-10-CM

## 2021-03-14 DIAGNOSIS — Z1231 Encounter for screening mammogram for malignant neoplasm of breast: Secondary | ICD-10-CM

## 2021-03-17 ENCOUNTER — Ambulatory Visit (HOSPITAL_BASED_OUTPATIENT_CLINIC_OR_DEPARTMENT_OTHER): Payer: Medicare Other | Admitting: Cardiology

## 2021-03-17 ENCOUNTER — Encounter (HOSPITAL_BASED_OUTPATIENT_CLINIC_OR_DEPARTMENT_OTHER): Payer: Self-pay | Admitting: Cardiology

## 2021-03-17 ENCOUNTER — Other Ambulatory Visit: Payer: Self-pay

## 2021-03-17 VITALS — BP 142/78 | HR 63 | Ht 60.0 in | Wt 150.4 lb

## 2021-03-17 DIAGNOSIS — R55 Syncope and collapse: Secondary | ICD-10-CM | POA: Diagnosis not present

## 2021-03-17 DIAGNOSIS — Z7189 Other specified counseling: Secondary | ICD-10-CM

## 2021-03-17 NOTE — Progress Notes (Incomplete)
Cardiology Office Note:    Date:  03/17/2021   ID:  Penny Lamb, DOB 06-29-1945, MRN [id number]  PCP:  Kathyrn Lass, MD  Cardiologist:  None  Referring MD: Kathyrn Lass, MD   No chief complaint on file.   History of Present Illness:    Penny Lamb is a 76 y.o. female with a hx of PVD, COPD, asthma, skin cancer, nephrolithiasis, IBS, and arthritis, who is seen as a new consult at the request of Kathyrn Lass, MD for the evaluation and management of syncope.   Notes from 02/27/2021 visit with Dr. Sabra Heck reviewed. Due to her syncopal episode being sudden and unexplained, she was referred to cardiology to rule out arrhythmic etiologies.  Syncope: Initial episode: She presented to the ED 02/16/2021 following a syncopal episode. Occurred at the exact moment as she tilted her head back to drink a glass of water. She describes this as "A sudden wave went from her head down, and felt like her legs were melting from under her".   Frequency: Isolated episode. Duration of episodes: Brief loss of consciousness. Presyncopal symptoms: "It was mostly dizziness, but with general weakness". Of note, she denies this was vertigo as she has experienced vertigo previously. Post syncope symptoms: Some confusion. She does not remember exactly what happened, or who made the phone call initially between her and her daughter. She believes she passed out because she does not remember hitting her head. However, she had noted tenderness on her head after she woke up. Aggravating/alleviating factors: Pre-existing medical conditions: PVD, COPD. 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 At 76 yo she developed a cyst that had wrapped around her colon. She would previously pass out due to the pain, but there was usually sufficient warning for her to lie down. Sometimes she would wake up in pain. At 76 yo she was diagnosed with IBS. She endorses asthma since she was 76 yo. Additionally  she is a former smoker. Potential medication/supplement interactions: Prior workup: Unremarkable CTA head and neck. Troponins x2 were negative. 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 ECG: 02/16/2021 showed sinus rhythm at 63 bpm, right atrial enlargement, borderline right axis deviation. Family history: Unknown cardiovascular history in her paternal family. Maternal grandmother had a pacemaker, and rheumatic fever as a child. Her mother died at 8 yo.  She states that for most of her life her blood pressure has been well controlled around 110/60, but she noticed increasing blood pressures within the last decade.  Every once in a while she feels a mild "thump-thump" in her chest. Generally this does not concern her and is not bothersome. She treats it as a reminder that there is some heart damage from smoking.  She denies any chest pain, or peripheral edema. No headaches, orthopnea, or PND.  Past Medical History:  Diagnosis Date   Allergy    RHINITIS   Arthritis    Asthma    Cancer ([place])    SKIN   Depression    Headache(784.0)    IBS (irritable bowel syndrome)    Nephrolithiasis    UTI (urinary tract infection)     Past Surgical History:  Procedure Laterality Date   ABDOMINAL HYSTERECTOMY     BREAST SURGERY     BIOPSY.....CYST ASPIRATED   COLON SURGERY     EYE SURGERY Left 2013   lens implant, catarct L    Current Medications: Current Outpatient Medications  on File Prior to Visit  Medication Sig   budesonide-formoterol (SYMBICORT) 80-4.5 MCG/ACT inhaler Inhale 2 puffs into the lungs every 12 (twelve) hours. (Patient not taking: Reported on 08/22/2017)   Calcium Carbonate-Vitamin D (CALCIUM-VITAMIN D) 600-200 MG-UNIT CAPS Take by mouth 2 (two) times daily.     Multiple Vitamins-Minerals (HAIR/SKIN/NAILS) TABS Take 1 tablet by mouth daily.   ondansetron (ZOFRAN ODT) 4 MG disintegrating tablet Take 1  tablet (4 mg total) by mouth every 8 (eight) hours as needed for nausea or vomiting.   traMADol (ULTRAM) 50 MG tablet Take 50 mg by mouth daily.   vitamin E 1000 UNIT capsule Take 1,000 Units by mouth daily.   No current facility-administered medications on file prior to visit.     Allergies:   Bactrim, Belladonna alkaloids, Codeine, Hydrocodone, Ibuprofen, Macrobid [nitrofurantoin macrocrystal], Macrolides and ketolides, Prednisone, Sulfa antibiotics, and Tramadol   Social History   Tobacco Use   Smoking status: Former    Packs/day: 0.25    Years: 52.00    Pack years: 13.00    Types: Cigarettes    Quit date: 01/22/2017    Years since quitting: 4.1   Smokeless tobacco: Never  Vaping Use   Vaping Use: Never used  Substance Use Topics   Alcohol use: No    Alcohol/week: 0.0 standard drinks   Drug use: No    Family History: family history includes Arthritis in an other family member; Asthma in an other family member; Cancer in an other family member; Cancer (age of onset: 73) in her mother; Dementia in her brother and mother; Hypertension in an other family member. There is no history of Breast cancer.  ROS:   Please see the history of present illness.  Additional pertinent ROS: Constitutional: Negative for chills, fever, night sweats, unintentional weight loss  HENT: Negative for ear pain and hearing loss.   Eyes: Negative for loss of vision and eye pain.  Respiratory: Negative for cough, sputum, wheezing.   Cardiovascular: See HPI. Gastrointestinal: Negative for abdominal pain, melena, and hematochezia.  Genitourinary: Negative for dysuria and hematuria.  Musculoskeletal: Negative for falls and myalgias.  Skin: Negative for itching and rash.  Neurological: Negative for focal weakness, focal sensory changes. Positive for loss of consciousness. Endo/Heme/Allergies: Does not bruise/bleed easily.     EKGs/Labs/Other Studies Reviewed:    The following studies were reviewed  today:  CTA Head/Neck 02/16/2021: FINDINGS: CTA NECK FINDINGS   Aortic arch: Standard 3 vessel aortic arch with widely patent arch vessel origins.   Right carotid system: Patent without evidence of stenosis, dissection, or significant atherosclerosis.   Left carotid system: Patent without evidence of stenosis, dissection, or significant atherosclerosis.   Vertebral arteries: Patent without evidence of flow limiting stenosis, dissection, or significant atherosclerosis. Mild narrowing of the left vertebral artery at C3 due to degenerative cervical spine spurring. Mildly dominant right vertebral artery.   Skeleton: Cervical spine evaluated in detail on today's earlier dedicated spine CT. No acute osseous abnormality or suspicious osseous lesion.   Other neck: No evidence of cervical lymphadenopathy or mass.   Upper chest: Clear lung apices.   Review of the MIP images confirms the above findings   CTA HEAD FINDINGS   Anterior circulation: The internal carotid arteries are patent from skull base to carotid termini with mild atherosclerotic plaque bilaterally not resulting in significant stenosis. A superiorly projecting right paraophthalmic ICA aneurysm measures 2 mm. ACAs and MCAs are patent without evidence of a proximal branch occlusion or significant  proximal stenosis.   Posterior circulation: The intracranial vertebral arteries are widely patent to the basilar. The basilar artery is widely patent. There are small posterior communicating arteries, left larger than right. Both PCAs are patent without evidence of a significant proximal stenosis. No aneurysm is identified.   Venous sinuses: Patent.   Anatomic variants: None.   Review of the MIP images confirms the above findings   IMPRESSION: 1. Mild intracranial atherosclerosis without large vessel occlusion or significant proximal stenosis. 2. 2 mm right paraophthalmic ICA aneurysm. 3. Widely patent cervical  carotid and vertebral arteries.  CT Chest 02/18/2017: FINDINGS: Cardiovascular: The heart size appears normal. Calcification identified within the LAD and left circumflex coronary artery.   Mediastinum/Nodes: No enlarged mediastinal or axillary lymph nodes. Thyroid gland, trachea, and esophagus demonstrate no significant findings.   Lungs/Pleura: No pleural effusion. The lungs are clear. No pneumothorax. Mild changes of emphysema. Mild diffuse bronchial wall thickening noted. 3 mm nodule identified within the right upper lobe, image 27 of series 4. Left upper lobe pulmonary nodule measures 4 mm, image 56 of series 4. Also in the left upper lobe is a 3 mm nodule, image 31 of series 4.   Upper Abdomen: No acute abnormality.   Musculoskeletal: Mild scoliosis deformity. Degenerative disc disease identified within the thoracic spine.   IMPRESSION: 1. No active cardiopulmonary abnormalities. 2. Mild diffuse bronchial wall thickening with emphysema, as above; imaging findings suggestive of underlying COPD. Emphysema (ICD10-J43.9). 3. Coronary artery calcification within the LAD and left circumflex.  EKG:  EKG is personally reviewed.   03/17/2021: ***  Recent Labs: 02/16/2021: ALT 23; BUN 16; Creatinine, Ser 0.82; Hemoglobin 14.6; Platelets 284; Potassium 3.6; Sodium 139   Recent Lipid Panel    Component Value Date/Time   CHOL 244 (H) 10/23/2013 1118   TRIG 219.0 (H) 10/23/2013 1118   HDL 54.40 10/23/2013 1118   CHOLHDL 4 10/23/2013 1118   VLDL 43.8 (H) 10/23/2013 1118   LDLDIRECT 99.6 10/23/2013 1118    Physical Exam:    VS:  There were no vitals taken for this visit.    Wt Readings from Last 3 Encounters:  02/16/21 150 lb 12.7 oz (68.4 kg)  03/11/17 140 lb 12.8 oz (63.9 kg)  05/17/14 144 lb (65.3 kg)    GEN: Well nourished, well developed in no acute distress HEENT: Normal, moist mucous membranes NECK: No JVD CARDIAC: regular rhythm, normal S1 and S2, no rubs or  gallops. No murmur. VASCULAR: Radial and DP pulses 2+ bilaterally. No carotid bruits RESPIRATORY:  Clear to auscultation without rales, wheezing or rhonchi  ABDOMEN: Soft, non-tender, non-distended MUSCULOSKELETAL:  Ambulates independently SKIN: Warm and dry, no edema NEUROLOGIC:  Alert and oriented x 3. No focal neuro deficits noted. PSYCHIATRIC:  Normal affect    ASSESSMENT:    No diagnosis found. PLAN:     Cardiac risk counseling and prevention recommendations: -recommend heart healthy/Mediterranean diet, with whole grains, fruits, vegetable, fish, lean meats, nuts, and olive oil. Limit salt. -recommend moderate walking, 3-5 times/week for 30-50 minutes each session. Aim for at least 150 minutes.week. Goal should be pace of 3 miles/hours, or walking 1.5 miles in 30 minutes -recommend avoidance of tobacco products. Avoid excess alcohol. -ASCVD risk score: The ASCVD Risk score (Arnett DK, et al., 2019) failed to calculate for the following reasons:   Cannot find a previous HDL lab   Cannot find a previous total cholesterol lab    Plan for follow up: PRN.  Buford Dresser, MD,  PhD, [facility]    Medication Adjustments/Labs and Tests Ordered: Current medicines are reviewed at length with the patient today.  Concerns regarding medicines are outlined above.   No orders of the defined types were placed in this encounter.  No orders of the defined types were placed in this encounter.  There are no Patient Instructions on file for this visit.   I,Mathew Stumpf,acting as a Education administrator for [employer], MD.,have documented all relevant documentation on the behalf of Buford Dresser, MD,as directed by  Buford Dresser, MD while in the presence of Buford Dresser, MD.  ***  Signed, Buford Dresser, MD PhD 03/17/2021 11:06 AM    [place]

## 2021-03-17 NOTE — Patient Instructions (Addendum)
Medication Instructions:  Your physician recommends that you continue on your current medications as directed. Please refer to the Current Medication list given to you today.   Labwork: NONE  Testing/Procedures: Your physician has requested that you have an echocardiogram. Echocardiography is a painless test that uses sound waves to create images of your heart. It provides your doctor with information about the size and shape of your heart and how well your hearts chambers and valves are working. This procedure takes approximately one hour. There are no restrictions for this procedure.  Follow-Up: AS NEEDED   Echocardiogram An echocardiogram is a test that uses sound waves (ultrasound) to produce images of the heart. Images from an echocardiogram can provide important information about: Heart size and shape. The size and thickness and movement of your heart's walls. Heart muscle function and strength. Heart valve function or if you have stenosis. Stenosis is when the heart valves are too narrow. If blood is flowing backward through the heart valves (regurgitation). A tumor or infectious growth around the heart valves. Areas of heart muscle that are not working well because of poor blood flow or injury from a heart attack. Aneurysm detection. An aneurysm is a weak or damaged part of an artery wall. The wall bulges out from the normal force of blood pumping through the body. Tell a health care provider about: Any allergies you have. All medicines you are taking, including vitamins, herbs, eye drops, creams, and over-the-counter medicines. Any blood disorders you have. Any surgeries you have had. Any medical conditions you have. Whether you are pregnant or may be pregnant. What are the risks? Generally, this is a safe test. However, problems may occur, including an allergic reaction to dye (contrast) that may be used during the test. What happens before the test? No specific  preparation is needed. You may eat and drink normally. What happens during the test?  You will take off your clothes from the waist up and put on a hospital gown. Electrodes or electrocardiogram (ECG)patches may be placed on your chest. The electrodes or patches are then connected to a device that monitors your heart rate and rhythm. You will lie down on a table for an ultrasound exam. A gel will be applied to your chest to help sound waves pass through your skin. A handheld device, called a transducer, will be pressed against your chest and moved over your heart. The transducer produces sound waves that travel to your heart and bounce back (or "echo" back) to the transducer. These sound waves will be captured in real-time and changed into images of your heart that can be viewed on a video monitor. The images will be recorded on a computer and reviewed by your health care provider. You may be asked to change positions or hold your breath for a short time. This makes it easier to get different views or better views of your heart. In some cases, you may receive contrast through an IV in one of your veins. This can improve the quality of the pictures from your heart. The procedure may vary among health care providers and hospitals. What can I expect after the test? You may return to your normal, everyday life, including diet, activities, and medicines, unless your health care provider tells you not to do that. Follow these instructions at home: It is up to you to get the results of your test. Ask your health care provider, or the department that is doing the test, when your results will  be ready. Keep all follow-up visits. This is important. Summary An echocardiogram is a test that uses sound waves (ultrasound) to produce images of the heart. Images from an echocardiogram can provide important information about the size and shape of your heart, heart muscle function, heart valve function, and other  possible heart problems. You do not need to do anything to prepare before this test. You may eat and drink normally. After the echocardiogram is completed, you may return to your normal, everyday life, unless your health care provider tells you not to do that. This information is not intended to replace advice given to you by your health care provider. Make sure you discuss any questions you have with your health care provider. Document Revised: 09/21/2020 Document Reviewed: 09/01/2019 Elsevier Patient Education  2022 [employer].

## 2021-03-27 ENCOUNTER — Other Ambulatory Visit: Payer: Self-pay

## 2021-03-27 ENCOUNTER — Ambulatory Visit (HOSPITAL_BASED_OUTPATIENT_CLINIC_OR_DEPARTMENT_OTHER): Payer: Medicare Other

## 2021-03-27 DIAGNOSIS — R55 Syncope and collapse: Secondary | ICD-10-CM

## 2021-03-27 LAB — ECHOCARDIOGRAM COMPLETE
AR max vel: 2.09 cm2
AV Area VTI: 1.99 cm2
AV Area mean vel: 1.81 cm2
AV Mean grad: 6 mmHg
AV Peak grad: 10.8 mmHg
Ao pk vel: 1.64 m/s
Area-P 1/2: 3.66 cm2
Calc EF: 81.5 %
S' Lateral: 1.76 cm
Single Plane A2C EF: 88.2 %
Single Plane A4C EF: 71 %

## 2021-04-06 ENCOUNTER — Ambulatory Visit: Payer: Medicare Other

## 2021-04-19 ENCOUNTER — Ambulatory Visit
Admission: RE | Admit: 2021-04-19 | Discharge: 2021-04-19 | Disposition: A | Discharge status: Home / Self Care | Payer: Medicare Other | Source: Ambulatory Visit | Attending: Family Medicine | Admitting: Family Medicine

## 2021-04-19 DIAGNOSIS — Z1231 Encounter for screening mammogram for malignant neoplasm of breast: Secondary | ICD-10-CM | POA: Diagnosis not present

## 2021-04-25 ENCOUNTER — Encounter (HOSPITAL_BASED_OUTPATIENT_CLINIC_OR_DEPARTMENT_OTHER): Payer: Self-pay | Admitting: Cardiology

## 2021-05-23 DIAGNOSIS — R52 Pain, unspecified: Secondary | ICD-10-CM | POA: Diagnosis not present

## 2021-05-23 DIAGNOSIS — M19011 Primary osteoarthritis, right shoulder: Secondary | ICD-10-CM | POA: Diagnosis not present

## 2021-05-23 DIAGNOSIS — M19012 Primary osteoarthritis, left shoulder: Secondary | ICD-10-CM | POA: Diagnosis not present

## 2021-05-31 DIAGNOSIS — M19012 Primary osteoarthritis, left shoulder: Secondary | ICD-10-CM | POA: Diagnosis not present

## 2021-05-31 DIAGNOSIS — M19011 Primary osteoarthritis, right shoulder: Secondary | ICD-10-CM | POA: Diagnosis not present

## 2021-06-05 DIAGNOSIS — R300 Dysuria: Secondary | ICD-10-CM | POA: Diagnosis not present

## 2021-06-05 DIAGNOSIS — R6883 Chills (without fever): Secondary | ICD-10-CM | POA: Diagnosis not present

## 2021-06-20 DIAGNOSIS — H43813 Vitreous degeneration, bilateral: Secondary | ICD-10-CM | POA: Diagnosis not present

## 2021-06-20 DIAGNOSIS — H02831 Dermatochalasis of right upper eyelid: Secondary | ICD-10-CM | POA: Diagnosis not present

## 2021-06-20 DIAGNOSIS — H2511 Age-related nuclear cataract, right eye: Secondary | ICD-10-CM | POA: Diagnosis not present

## 2021-06-20 DIAGNOSIS — Z961 Presence of intraocular lens: Secondary | ICD-10-CM | POA: Diagnosis not present

## 2021-07-21 DIAGNOSIS — H5213 Myopia, bilateral: Secondary | ICD-10-CM | POA: Diagnosis not present

## 2021-07-24 DIAGNOSIS — M7742 Metatarsalgia, left foot: Secondary | ICD-10-CM | POA: Diagnosis not present

## 2021-07-24 DIAGNOSIS — M7741 Metatarsalgia, right foot: Secondary | ICD-10-CM | POA: Diagnosis not present

## 2021-07-24 DIAGNOSIS — K589 Irritable bowel syndrome without diarrhea: Secondary | ICD-10-CM | POA: Diagnosis not present

## 2021-07-24 DIAGNOSIS — J4520 Mild intermittent asthma, uncomplicated: Secondary | ICD-10-CM | POA: Diagnosis not present

## 2021-08-11 DIAGNOSIS — H0102A Squamous blepharitis right eye, upper and lower eyelids: Secondary | ICD-10-CM | POA: Diagnosis not present

## 2021-08-11 DIAGNOSIS — H0102B Squamous blepharitis left eye, upper and lower eyelids: Secondary | ICD-10-CM | POA: Diagnosis not present

## 2021-08-11 DIAGNOSIS — H00022 Hordeolum internum right lower eyelid: Secondary | ICD-10-CM | POA: Diagnosis not present

## 2021-10-24 DIAGNOSIS — M160 Bilateral primary osteoarthritis of hip: Secondary | ICD-10-CM | POA: Diagnosis not present

## 2021-10-24 DIAGNOSIS — M79672 Pain in left foot: Secondary | ICD-10-CM | POA: Diagnosis not present

## 2021-10-24 DIAGNOSIS — R2689 Other abnormalities of gait and mobility: Secondary | ICD-10-CM | POA: Diagnosis not present

## 2021-10-24 DIAGNOSIS — M79671 Pain in right foot: Secondary | ICD-10-CM | POA: Diagnosis not present

## 2021-10-28 DIAGNOSIS — M25551 Pain in right hip: Secondary | ICD-10-CM | POA: Diagnosis not present

## 2021-10-28 DIAGNOSIS — R2689 Other abnormalities of gait and mobility: Secondary | ICD-10-CM | POA: Diagnosis not present

## 2021-11-01 ENCOUNTER — Other Ambulatory Visit: Payer: Self-pay | Admitting: Sports Medicine

## 2021-11-01 ENCOUNTER — Ambulatory Visit
Admission: RE | Admit: 2021-11-01 | Discharge: 2021-11-01 | Disposition: A | Discharge status: Home / Self Care | Payer: Medicare Other | Source: Ambulatory Visit | Attending: Sports Medicine | Admitting: Sports Medicine

## 2021-11-01 DIAGNOSIS — M25551 Pain in right hip: Secondary | ICD-10-CM | POA: Diagnosis not present

## 2021-11-01 DIAGNOSIS — M25552 Pain in left hip: Secondary | ICD-10-CM | POA: Diagnosis not present

## 2022-03-14 DIAGNOSIS — M25561 Pain in right knee: Secondary | ICD-10-CM | POA: Diagnosis not present

## 2022-03-14 DIAGNOSIS — M1611 Unilateral primary osteoarthritis, right hip: Secondary | ICD-10-CM | POA: Diagnosis not present

## 2022-03-23 DIAGNOSIS — Z23 Encounter for immunization: Secondary | ICD-10-CM | POA: Diagnosis not present

## 2022-03-23 DIAGNOSIS — Z0000 Encounter for general adult medical examination without abnormal findings: Secondary | ICD-10-CM | POA: Diagnosis not present

## 2022-04-16 DIAGNOSIS — Z03818 Encounter for observation for suspected exposure to other biological agents ruled out: Secondary | ICD-10-CM | POA: Diagnosis not present

## 2022-04-16 DIAGNOSIS — R051 Acute cough: Secondary | ICD-10-CM | POA: Diagnosis not present

## 2022-04-23 DIAGNOSIS — J40 Bronchitis, not specified as acute or chronic: Secondary | ICD-10-CM | POA: Diagnosis not present

## 2022-05-03 ENCOUNTER — Emergency Department (HOSPITAL_BASED_OUTPATIENT_CLINIC_OR_DEPARTMENT_OTHER): Payer: Medicare Other

## 2022-05-03 ENCOUNTER — Emergency Department (HOSPITAL_BASED_OUTPATIENT_CLINIC_OR_DEPARTMENT_OTHER)
Admission: EM | Admit: 2022-05-03 | Discharge: 2022-05-03 | Disposition: A | Discharge status: Home / Self Care | Payer: Medicare Other | Attending: Emergency Medicine | Admitting: Emergency Medicine

## 2022-05-03 ENCOUNTER — Encounter (HOSPITAL_BASED_OUTPATIENT_CLINIC_OR_DEPARTMENT_OTHER): Payer: Self-pay | Admitting: Pediatrics

## 2022-05-03 ENCOUNTER — Other Ambulatory Visit: Payer: Self-pay

## 2022-05-03 DIAGNOSIS — R059 Cough, unspecified: Secondary | ICD-10-CM | POA: Diagnosis not present

## 2022-05-03 DIAGNOSIS — R051 Acute cough: Secondary | ICD-10-CM | POA: Diagnosis not present

## 2022-05-03 DIAGNOSIS — R0602 Shortness of breath: Secondary | ICD-10-CM | POA: Diagnosis not present

## 2022-05-03 LAB — COMPREHENSIVE METABOLIC PANEL
ALT: 16 U/L (ref 0–44)
AST: 20 U/L (ref 15–41)
Albumin: 3.3 g/dL — ABNORMAL LOW (ref 3.5–5.0)
Alkaline Phosphatase: 58 U/L (ref 38–126)
Anion gap: 11 (ref 5–15)
BUN: 17 mg/dL (ref 8–23)
CO2: 26 mmol/L (ref 22–32)
Calcium: 8.9 mg/dL (ref 8.9–10.3)
Chloride: 100 mmol/L (ref 98–111)
Creatinine, Ser: 0.86 mg/dL (ref 0.44–1.00)
GFR, Estimated: >60 mL/min (ref >60)
Glucose, Bld: 91 mg/dL (ref 70–99)
Potassium: 3.8 mmol/L (ref 3.5–5.1)
Sodium: 137 mmol/L (ref 135–145)
Total Bilirubin: 0.6 mg/dL (ref 0.3–1.2)
Total Protein: 6.9 g/dL (ref 6.5–8.1)

## 2022-05-03 LAB — CBC WITH DIFFERENTIAL/PLATELET
Abs Immature Granulocytes: 0.05 10*3/uL (ref 0.00–0.07)
Basophils Absolute: 0.1 10*3/uL (ref 0.0–0.1)
Basophils Relative: 1 %
Eosinophils Absolute: 0.6 10*3/uL — ABNORMAL HIGH (ref 0.0–0.5)
Eosinophils Relative: 9 %
HCT: 38 % (ref 36.0–46.0)
Hemoglobin: 12.4 g/dL (ref 12.0–15.0)
Immature Granulocytes: 1 %
Lymphocytes Relative: 21 %
Lymphs Abs: 1.5 10*3/uL (ref 0.7–4.0)
MCH: 30.4 pg (ref 26.0–34.0)
MCHC: 32.6 g/dL (ref 30.0–36.0)
MCV: 93.1 fL (ref 80.0–100.0)
Monocytes Absolute: 0.6 10*3/uL (ref 0.1–1.0)
Monocytes Relative: 9 %
Neutro Abs: 4.2 10*3/uL (ref 1.7–7.7)
Neutrophils Relative %: 59 %
Platelets: 360 10*3/uL (ref 150–400)
RBC: 4.08 MIL/uL (ref 3.87–5.11)
RDW: 13.7 % (ref 11.5–15.5)
WBC: 7 10*3/uL (ref 4.0–10.5)
nRBC: 0 % (ref 0.0–0.2)

## 2022-05-03 LAB — TROPONIN I (HIGH SENSITIVITY): Troponin I (High Sensitivity): 4 ng/L (ref <18)

## 2022-05-03 MED ORDER — IPRATROPIUM-ALBUTEROL 0.5-2.5 (3) MG/3ML IN SOLN
3.0000 mL | Freq: Once | RESPIRATORY_TRACT | Status: AC
  Administered 2022-05-03: 3 mL via RESPIRATORY_TRACT
  Filled 2022-05-03: qty 3

## 2022-05-03 MED ORDER — DEXAMETHASONE 4 MG PO TABS
10.0000 mg | ORAL_TABLET | Freq: Once | ORAL | Status: AC
  Administered 2022-05-03: 10 mg via ORAL
  Filled 2022-05-03: qty 3

## 2022-05-03 NOTE — Discharge Instructions (Signed)
Follow up with your family doctor in the office.  Return for worsening difficulty breathing.

## 2022-05-03 NOTE — ED Triage Notes (Signed)
Reports starting having cough and sore throat 2 weeks after PNA vacc in beginning of March, she was then given cough medication  by her PCP. A week later she was seen at urgent care where she was given Amoxicillin but she was not able to tolerate this, stated tessalon pearls was not helpful. Here today for progressive cough and weakness.

## 2022-05-03 NOTE — ED Provider Notes (Signed)
[facility] Provider Note   CSN: [id number] Arrival date & time: 05/03/22  1252     History  Chief Complaint  Patient presents with   Cough   Fatigue    Penny Lamb is a 77 y.o. female.  77 yo F   With a chief complaint of ongoing cough.  She thinks this has been going on for about 6 weeks now.  She has seen her family doctor for this as well as urgent care. She  thinks it my be due to  Pneumococci vaccine.  She started with cough and congestion 2 weeks post.  Started on abx at urgent care but had significant vomiting.  Felt not improving, here for eval.    Cough      Home Medications Prior to Admission medications   Medication Sig Start Date End Date Taking? Authorizing Provider  acetaminophen (TYLENOL) 650 MG CR tablet 2 tablets as needed    [provider]  albuterol (VENTOLIN HFA) 108 (90 Base) MCG/ACT inhaler 1-2 puffs as needed 03/11/19   [provider]  budesonide-formoterol (SYMBICORT) 80-4.5 MCG/ACT inhaler Inhale 2 puffs into the lungs every 12 (twelve) hours. 03/11/17   Nyoka Cowden, MD  Calcium Carbonate-Vitamin D (CALCIUM-VITAMIN D) 600-200 MG-UNIT CAPS Take by mouth 2 (two) times daily.      [provider]  diphenhydramine-acetaminophen (TYLENOL PM EXTRA STRENGTH) 25-500 MG TABS tablet 1 tablet at bedtime as needed    [provider]  Multiple Vitamins-Minerals (HAIR/SKIN/NAILS) TABS Take 1 tablet by mouth daily.    [provider]  ondansetron (ZOFRAN ODT) 4 MG disintegrating tablet Take 1 tablet (4 mg total) by mouth every 8 (eight) hours as needed for nausea or vomiting. Patient not taking: Reported on 03/17/2021 08/22/17   Kellie Shropshire, PA-C  traMADol (ULTRAM) 50 MG tablet Take 50 mg by mouth daily. Patient not taking: Reported on 03/17/2021 08/21/17   [provider]  vitamin E 1000 UNIT capsule Take 1,000 Units by mouth daily.    [provider]      Allergies    Amoxicillin, Bactrim, Belladonna alkaloids, Codeine, Hydrocodone, Hydrocodone-acetaminophen, Ibuprofen, Macrobid [nitrofurantoin macrocrystal], Macrolides and ketolides, Prednisone, Sulfa antibiotics, Sulfamethoxazole-trimethoprim, and Tramadol    Review of Systems   Review of Systems  Respiratory:  Positive for cough.     Physical Exam Updated Vital Signs BP (!) 166/70 (BP Location: Left Arm)   Temp 99.1 F (37.3 C) (Oral)   Resp 18   Ht 5\' 1"  (1.549 m)   Wt 63 kg   SpO2 99%   BMI 26.26 kg/m  Physical Exam Vitals and nursing note reviewed.  Constitutional:      General: She is not in acute distress.    Appearance: She is well-developed. She is not diaphoretic.  HENT:     Head: Normocephalic and atraumatic.     Comments: Swollen turbinates, posterior nasal drip, no noted sinus ttp, tm normal bilaterally.   Eyes:     Pupils: Pupils are equal, round, and reactive to light.  Cardiovascular:     Rate and Rhythm: Normal rate and regular rhythm.     Heart sounds: No murmur heard.    No friction rub. No gallop.  Pulmonary:     Effort: Pulmonary effort is normal.     Breath sounds: No wheezing or rales.  Abdominal:     General: There is no distension.     Palpations: Abdomen is soft.  Tenderness: There is no abdominal tenderness.  Musculoskeletal:        General: No tenderness.     Cervical back: Normal range of motion and neck supple.  Skin:    General: Skin is warm and dry.  Neurological:     Mental Status: She is alert and oriented to person, place, and time.  Psychiatric:        Behavior: Behavior normal.     ED Results / Procedures / Treatments   Labs (all labs ordered are listed, but only abnormal results are displayed) Labs Reviewed  CBC WITH DIFFERENTIAL/PLATELET - Abnormal; Notable for the following components:      Result Value   Eosinophils Absolute 0.6 (*)    All other components within normal limits   COMPREHENSIVE METABOLIC PANEL - Abnormal; Notable for the following components:   Albumin 3.3 (*)    All other components within normal limits  TROPONIN I (HIGH SENSITIVITY)  TROPONIN I (HIGH SENSITIVITY)    EKG EKG Interpretation  Date/Time:  Thursday May 03 2022 13:09:07 EDT Ventricular Rate:  95 PR Interval:  136 QRS Duration: 92 QT Interval:  340 QTC Calculation: 428 R Axis:   75 Text Interpretation: Sinus rhythm Right atrial enlargement No significant change since last tracing Confirmed by Melene Plan [phone]) on 05/03/2022 2:18:13 PM  Radiology DG Chest Portable 1 View  Result Date: 05/03/2022 CLINICAL DATA:  Cough. EXAM: PORTABLE CHEST 1 VIEW COMPARISON:  January 09, 2012 FINDINGS: Tortuosity of the aorta and mild calcific atherosclerotic disease. Cardiomediastinal silhouette is normal. Mediastinal contours appear intact. Minimal peribronchial airspace consolidation in the left lower lobe. Osseous structures are without acute abnormality. Soft tissues are grossly normal. IMPRESSION: Minimal peribronchial airspace consolidation in the left lower lobe may represent atelectasis or early pneumonia. Electronically Signed   By: Ted Mcalpine M.D.   On: 05/03/2022 13:26    Procedures Procedures    Medications Ordered in ED Medications  ipratropium-albuterol (DUONEB) 0.5-2.5 (3) MG/3ML nebulizer solution 3 mL (3 mLs Nebulization Given 05/03/22 1353)  dexamethasone (DECADRON) tablet 10 mg (10 mg Oral Given 05/03/22 1452)    ED Course/ Medical Decision Making/ A&P                             Medical Decision Making Amount and/or Complexity of Data Reviewed Labs: ordered. Radiology: ordered.  Risk Prescription drug management.   77 yo  With a chief complaints of persistent cough.  Going on for about 6 weeks.  My exam consistent with likely URI.  No adventitious lung sounds.  Ddiminished breath sounds diffusely.  Chest x-ray independently reviewed by me with  Perhaps  the left low  Lower lobe infiltrate versus atelectasis.  She is not takening deep breaths on exam and so I would suspect likely atelectasis.  S/sx of URI on exam.    No leukocytosis, no anemia.  No significant electrolyte abnormality.  Given trial of duoneb without obvious improvement.  Give dose of decadron here. Offered antibiotics for possible atypical pneumonia.  Patient declining at this time due to multiple drug allergies. Has appointment to see PCP tomorrow.   3:17 PM:  I have discussed the diagnosis/risks/treatment options with the patient and family.  Evaluation and diagnostic testing in the emergency department does not suggest an emergent condition requiring admission or immediate intervention beyond what has been performed at this time.  They will follow up with PCP. We also discussed returning to the  ED immediately if new or worsening sx occur. We discussed the sx which are most concerning (e.g., sudden worsening sob, fever, inability to tolerate by mouth) that necessitate immediate return. Medications administered to the patient during their visit and any new prescriptions provided to the patient are listed below.  Medications given during this visit Medications  ipratropium-albuterol (DUONEB) 0.5-2.5 (3) MG/3ML nebulizer solution 3 mL (3 mLs Nebulization Given 05/03/22 1353)  dexamethasone (DECADRON) tablet 10 mg (10 mg Oral Given 05/03/22 1452)     The patient appears reasonably screen and/or stabilized for discharge and I doubt any other medical condition or other [facility] requiring further screening, evaluation, or treatment in the ED at this time prior to discharge.            Final Clinical Impression(s) / ED Diagnoses Final diagnoses:  Acute cough    Rx / DC Orders ED Discharge Orders     None         Melene PlanFloyd, Elzie Sheets, DO 05/03/22 1519

## 2022-05-04 DIAGNOSIS — R051 Acute cough: Secondary | ICD-10-CM | POA: Diagnosis not present

## 2022-05-04 DIAGNOSIS — J069 Acute upper respiratory infection, unspecified: Secondary | ICD-10-CM | POA: Diagnosis not present

## 2022-05-16 DIAGNOSIS — M25551 Pain in right hip: Secondary | ICD-10-CM | POA: Diagnosis not present

## 2022-05-16 DIAGNOSIS — M25561 Pain in right knee: Secondary | ICD-10-CM | POA: Diagnosis not present

## 2022-06-26 DIAGNOSIS — H02831 Dermatochalasis of right upper eyelid: Secondary | ICD-10-CM | POA: Diagnosis not present

## 2022-06-26 DIAGNOSIS — H43813 Vitreous degeneration, bilateral: Secondary | ICD-10-CM | POA: Diagnosis not present

## 2022-06-26 DIAGNOSIS — H2511 Age-related nuclear cataract, right eye: Secondary | ICD-10-CM | POA: Diagnosis not present

## 2022-06-26 DIAGNOSIS — H04123 Dry eye syndrome of bilateral lacrimal glands: Secondary | ICD-10-CM | POA: Diagnosis not present

## 2022-06-28 DIAGNOSIS — M1611 Unilateral primary osteoarthritis, right hip: Secondary | ICD-10-CM | POA: Diagnosis not present

## 2022-08-03 DIAGNOSIS — Z1330 Encounter for screening examination for mental health and behavioral disorders, unspecified: Secondary | ICD-10-CM | POA: Diagnosis not present

## 2022-08-03 DIAGNOSIS — Z131 Encounter for screening for diabetes mellitus: Secondary | ICD-10-CM | POA: Diagnosis not present

## 2022-08-03 DIAGNOSIS — Z01818 Encounter for other preprocedural examination: Secondary | ICD-10-CM | POA: Diagnosis not present

## 2022-08-23 NOTE — Progress Notes (Signed)
NEW PATIENT Date of Service/Encounter:  08/24/22 Referring provider: Sigmund Hazel, MD Primary care provider: Macy Mis, MD  Subjective:  Penny Lamb is a 77 y.o. female presenting today for evaluation of multiple antibiotic allergies. History obtained from: chart review and patient.   This patient has scheduled total hip arthroplasty August 21.  She has been referred due to multiple antibiotic allergies listed in her chart. She does not remember many of the reactions that she has had to most of the medications.  She asked this provider to refer to her allergy list for details. Her allergy list includes amoxicillin, Bactrim, belladonna, codeine, hydrocodone acetaminophen, ibuprofen, Macrobid, macrolides and ketolides, prednisone, sulfa antibiotics, sulfamethoxazole trimethoprim, tramadol. She does remember having seizures with tramadol and is still very emotional regarding this reaction.  She was told by her provider that this medication would be well-tolerated. She reports that this list has grown over many years. Most of her reactions to antibiotics have listed diarrhea as the main symptom, which we discussed could be an intolerance. She most recently was given amoxicillin several months ago.  She had an asthma flare at that time with congestion.  She was diagnosed with bronchitis and given amoxicillin.  Within 35 minutes of her first dose she developed severe vomiting which she describes as projectile, lasting 3 hours.  This was only 3 months ago. She does become tearful throughout our encounter recounting her previous reactions, and nervous about any allergy testing going forward.  Chart Review:  Reviewed PCP notes from referral 07/13/2022: Multiple antibiotic allergic reactions, need option for perioperative prophylaxis  2016 encounter at our clinic for lidocaine allergy with Dr. Lucie Leather: "Navpreet returns to this clinic in investigation of lidocaine reaction. She underwent an  incremental challenge with 2% lidocaine (without Epi) over the course of approximately 3.5 hours. She received lidocaine via epicutaneous, intradermal and subcutaneous route. She did not demonstrate any reaction against this exposure. She had a total of approximately 1.5 ml administered. "   Past Medical History: Past Medical History:  Diagnosis Date   Allergy    RHINITIS   Arthritis    Asthma    Cancer (HCC)    SKIN   Depression    Headache(784.0)    IBS (irritable bowel syndrome)    Nephrolithiasis    UTI (urinary tract infection)    Medication List:  Current Outpatient Medications  Medication Sig Dispense Refill   acetaminophen (TYLENOL) 500 MG tablet Take 1,000 mg by mouth at bedtime.     albuterol (VENTOLIN HFA) 108 (90 Base) MCG/ACT inhaler Inhale 1-2 puffs into the lungs every 6 (six) hours as needed for wheezing or shortness of breath.     cefdinir (OMNICEF) 250 MG/5ML suspension Do NOT take. Please bring to your allergy appointment. 60 mL 0   Cholecalciferol (VITAMIN D3) 250 MCG (10000 UT) capsule Take 10,000 Units by mouth daily.     cyclobenzaprine (FLEXERIL) 10 MG tablet Take 10 mg by mouth at bedtime.     dicyclomine (BENTYL) 10 MG capsule Take 10 mg by mouth daily as needed for spasms.     Multiple Vitamins-Minerals (EMERGEN-C VITAMIN C) PACK Take 1 packet by mouth daily.     Multiple Vitamins-Minerals (HAIR SKIN AND NAILS FORMULA) TABS Take 2 tablets by mouth daily.     Omega-3 Fatty Acids (FISH OIL) 1000 MG CAPS Take 1 capsule by mouth daily.     OVER THE COUNTER MEDICATION Take 1 capsule by mouth daily. Youth and Tonic supplement  Polyethyl Glycol-Propyl Glycol (SYSTANE OP) Place 1 drop into both eyes 2 (two) times daily.     sodium chloride (OCEAN) 0.65 % SOLN nasal spray Place 1 spray into both nostrils as needed for congestion.     No current facility-administered medications for this visit.   Known Allergies:  Allergies  Allergen Reactions   Amoxicillin  Nausea And Vomiting   Bactrim Diarrhea and Nausea And Vomiting    Headache severe   Belladonna Alkaloids Nausea And Vomiting   Codeine Nausea And Vomiting   Hydrocodone-Acetaminophen Nausea And Vomiting    migraine   Ibuprofen Nausea And Vomiting   Macrobid [Nitrofurantoin Macrocrystal]     Unknown reaction   Macrolides And Ketolides Nausea And Vomiting   Prednisone     psychosis   Sulfa Antibiotics Diarrhea   Sulfamethoxazole-Trimethoprim Diarrhea   Tramadol     Seizures    Past Surgical History: Past Surgical History:  Procedure Laterality Date   ABDOMINAL HYSTERECTOMY     BREAST SURGERY     BIOPSY.....CYST ASPIRATED   COLON SURGERY     EYE SURGERY Left 2013   lens implant, catarct L   Family History: Family History  Problem Relation Age of Onset   Cancer Other        SKIN   Asthma Other    Arthritis Other    Hypertension Other    Dementia Mother    Cancer Mother 47       stomach cancer   Dementia Brother    Breast cancer Neg Hx    Social History: Penny Lamb lives in a house built 40 years ago, no water damage, vinyl floors, heat pump heating and gas, indoor cats and dogs, no roaches, using dust mite protection on the bedding and pillows, no smoke exposure.  She is retired.  [place]  + HEPA filter in the home.  Smoked from [age]-2017.   ROS:  All other systems negative except as noted per HPI.  Objective:  Blood pressure 128/66, pulse 99, temperature 98.7 F (37.1 C), height 5\' 1"  (1.549 m), weight 140 lb 4.8 oz (63.6 kg), SpO2 100%. Body mass index is 26.51 kg/m. Physical Exam:  General Appearance:  Alert, cooperative, no distress, appears stated age  Head:  Normocephalic, without obvious abnormality, atraumatic  Eyes:  Conjunctiva clear, EOM's intact  Nose: Nares normal,  no rhinorrhea  Throat: Lips, tongue normal; teeth and gums normal,  moist mucus membranes  Neck: Supple, symmetrical  Lungs:   Respirations unlabored,  no coughing  Heart:  , Appears well perfused  Extremities: No edema  Skin: no rashes or lesions on visualized portions of skin  Neurologic: No gross deficits   Diagnostics: None  Labs:  Lab Orders  No laboratory test(s) ordered today     Assessment and Plan  Based on your current allergy list, we can challenge you to cefdinir-a third generation cephalosporin. You do not have any medications on your list that are cross reactive with this medication making your risk of reaction similar to that of the general public.  We can not test for any antibiotics with the exception of penicillin which you have refused. There is no validated testing for trimethoprim sulfamethoxazole.  This can only be ruled out by an oral challenge which I am hesitant to offer since we do not know the initial reaction.  The same is true for nitrofurantoin.  There is some literature providing guidance for testing with macrolides, however it  is not clinically validated.  It is also something that would be hard to perform in an nonacademic setting, and generally is easy to avoid.    Additionally, she does not have listed allergies to clindamycin, quinolones, linezolid, vancomycin, tetracyclines or aminoglycosides.  Any of the medications from these classes should be acceptable alternatives since they have no cross-reactivity with sulfas, macrolides, or amoxicillin-the 3 classes currently listed in her chart. My impression from speaking with Ms. Durfee is that she is unwilling to try any new antibiotics without previous testing which I explained in detail would not be possible.  She is willing to undergo oral challenge to cefdinir but was very hesitant in taking this approach.  Please bring liquid cefdinir to your follow-up appointment.  Must be in good health, not on antibiotics, no recent fevers or vaccines for your challenge appointment. Must bring medication to clinic. Do NOT take.   Follow up : for oral  challenge to cefdinir  It was a pleasure meeting you in clinic today! Thank you for allowing me to participate in your care.  This note in its entirety was forwarded to the Provider who requested this consultation.  Other:  none  Thank you for your kind referral. I appreciate the opportunity to take part in Penny Lamb's care. Please do not hesitate to contact me with questions.  Sincerely,  Tonny Bollman, MD [facility]    Total Time: "65 minutes"  Time spent on day of service preparing to see patient, obtaining and reviewing separately obtained history, performing examination, counseling and educating patient, ordering medications, documenting clinical information in the health record.

## 2022-08-24 ENCOUNTER — Other Ambulatory Visit: Payer: Self-pay

## 2022-08-24 ENCOUNTER — Encounter: Payer: Self-pay | Admitting: Internal Medicine

## 2022-08-24 ENCOUNTER — Ambulatory Visit: Payer: Medicare Other | Admitting: Internal Medicine

## 2022-08-24 VITALS — BP 128/66 | HR 99 | Temp 98.7°F | Ht 61.0 in | Wt 140.3 lb

## 2022-08-24 DIAGNOSIS — Z881 Allergy status to other antibiotic agents status: Secondary | ICD-10-CM

## 2022-08-24 DIAGNOSIS — T3695XA Adverse effect of unspecified systemic antibiotic, initial encounter: Secondary | ICD-10-CM

## 2022-08-24 MED ORDER — CEFDINIR 250 MG/5ML PO SUSR: ORAL | 0 refills | Status: DC

## 2022-08-24 NOTE — Patient Instructions (Addendum)
Based on your current allergy list, we can challenge you to cefdinir-a third generation cephalosporin. You do not have any medications on your list that are cross reactive with this medication making your risk of reaction similar to that of the general public.  We can not test for any antibiotics with the exception of penicillin which you have refused.  Please bring liquid cefdinir to your follow-up appointment.  Must be in good health, not on antibiotics, no recent fevers or vaccines for your challenge appointment. Must bring medication to clinic. Do NOT take.   Follow up : for oral challenge to cefdinir  It was a pleasure meeting you in clinic today! Thank you for allowing me to participate in your care.  Tonny Bollman, MD [facility]

## 2022-08-24 NOTE — Patient Instructions (Addendum)
SURGICAL WAITING ROOM VISITATION Patients having surgery or a procedure may have no more than 2 support people in the waiting area - these visitors may rotate.    Children under the age of 68 must have an adult with them who is not the patient.  If the patient needs to stay at the hospital during part of their recovery, the visitor guidelines for inpatient rooms apply. Pre-op nurse will coordinate an appropriate time for 1 support person to accompany patient in pre-op.  This support person may not rotate.    Please refer to the [facility] website for the visitor guidelines for Inpatients (after your surgery is over and you are in a regular room).       Your procedure is scheduled on: 09-12-22   Report to [facility] Main Entrance    Report to admitting at 11:30 AM   Call this number if you have problems the morning of surgery [phone]   Do not eat food :After Midnight.   After Midnight you may have the following liquids until 11:00 AM DAY OF SURGERY  Water Non-Citrus Juices (without pulp, NO RED-Apple, White grape, White cranberry) Black Coffee (NO MILK/CREAM OR CREAMERS, sugar ok)  Clear Tea (NO MILK/CREAM OR CREAMERS, sugar ok) regular and decaf                             Plain Jell-O (NO RED)                                           Fruit ices (not with fruit pulp, NO RED)                                     Popsicles (NO RED)                                                               Sports drinks like Gatorade (NO RED)                   The day of surgery:  Drink ONE (1) Pre-Surgery Clear Ensure at 11:00 AM the morning of surgery. Drink in one sitting. Do not sip.  This drink was given to you during your hospital  pre-op appointment visit. Nothing else to drink after completing the Pre-Surgery Clear Ensure.          If you have questions, please contact your surgeon's office.   FOLLOW ANY ADDITIONAL PRE OP INSTRUCTIONS YOU RECEIVED FROM YOUR  SURGEON'S OFFICE!!!     Oral Hygiene is also important to reduce your risk of infection.                                    Remember - BRUSH YOUR TEETH THE MORNING OF SURGERY WITH YOUR REGULAR TOOTHPASTE   Do NOT smoke after Midnight   Take these medicines the morning of surgery with A SIP OF WATER:   Okay to use Albuterol inhaler  Stop all vitamins and herbal supplements 7 days before surgery  Bring CPAP mask and tubing day of surgery.                              You may not have any metal on your body including hair pins, jewelry, and body piercing             Do not wear make-up, lotions, powders, perfumes or deodorant  Do not wear nail polish including gel and S&S, artificial/acrylic nails, or any other type of covering on natural nails including finger and toenails. If you have artificial nails, gel coating, etc. that needs to be removed by a nail salon please have this removed prior to surgery or surgery may need to be canceled/ delayed if the surgeon/ anesthesia feels like they are unable to be safely monitored.   Do not shave  48 hours prior to surgery.    Do not bring valuables to the hospital. [facility] IS NOT RESPONSIBLE   FOR VALUABLES.   Contacts, dentures or bridgework may not be worn into surgery.   Bring small overnight bag day of surgery.   DO NOT BRING YOUR HOME MEDICATIONS TO THE HOSPITAL. PHARMACY WILL DISPENSE MEDICATIONS LISTED ON YOUR MEDICATION LIST TO YOU DURING YOUR ADMISSION IN THE HOSPITAL!     Special Instructions: Bring a copy of your healthcare power of attorney and living will documents the day of surgery if you haven't scanned them before.              Please read over the following fact sheets you were given: IF YOU HAVE QUESTIONS ABOUT YOUR PRE-OP INSTRUCTIONS PLEASE CALL [phone] Gwen  If you received a COVID test during your pre-op visit  it is requested that you wear a mask when out in public, stay away from anyone that may not be  feeling well and notify your surgeon if you develop symptoms. If you test positive for Covid or have been in contact with anyone that has tested positive in the last 10 days please notify you surgeon.    Pre-operative 5 CHG Bath Instructions   You can play a key role in reducing the risk of infection after surgery. Your skin needs to be as free of germs as possible. You can reduce the number of germs on your skin by washing with CHG (chlorhexidine gluconate) soap before surgery. CHG is an antiseptic soap that kills germs and continues to kill germs even after washing.   DO NOT use if you have an allergy to chlorhexidine/CHG or antibacterial soaps. If your skin becomes reddened or irritated, stop using the CHG and notify one of our RNs at  [phone] .   Please shower with the CHG soap starting 4 days before surgery using the following schedule:     Please keep in mind the following:  DO NOT shave, including legs and underarms, starting the day of your first shower.   You may shave your face at any point before/day of surgery.  Place clean sheets on your bed the day you start using CHG soap. Use a clean washcloth (not used since being washed) for each shower. DO NOT sleep with pets once you start using the CHG.   CHG Shower Instructions:  If you choose to wash your hair and private area, wash first with your normal shampoo/soap.  After you use shampoo/soap, rinse your hair and body thoroughly to remove  shampoo/soap residue.  Turn the water OFF and apply about 3 tablespoons (45 ml) of CHG soap to a CLEAN washcloth.  Apply CHG soap ONLY FROM YOUR NECK DOWN TO YOUR TOES (washing for 3-5 minutes)  DO NOT use CHG soap on face, private areas, open wounds, or sores.  Pay special attention to the area where your surgery is being performed.  If you are having back surgery, having someone wash your back for you may be helpful. Wait 2 minutes after CHG soap is applied, then you may rinse off the  CHG soap.  Pat dry with a clean towel  Put on clean clothes/pajamas   If you choose to wear lotion, please use ONLY the CHG-compatible lotions on the back of this paper.     Additional instructions for the day of surgery: DO NOT APPLY any lotions, deodorants, cologne, or perfumes.   Put on clean/comfortable clothes.  Brush your teeth.  Ask your nurse before applying any prescription medications to the skin.      CHG Compatible Lotions   Aveeno Moisturizing lotion  Cetaphil Moisturizing Cream  Cetaphil Moisturizing Lotion  Clairol Herbal Essence Moisturizing Lotion, Dry Skin  Clairol Herbal Essence Moisturizing Lotion, Extra Dry Skin  Clairol Herbal Essence Moisturizing Lotion, Normal Skin  Curel Age Defying Therapeutic Moisturizing Lotion with Alpha Hydroxy  Curel Extreme Care Body Lotion  Curel Soothing Hands Moisturizing Hand Lotion  Curel Therapeutic Moisturizing Cream, Fragrance-Free  Curel Therapeutic Moisturizing Lotion, Fragrance-Free  Curel Therapeutic Moisturizing Lotion, Original Formula  Eucerin Daily Replenishing Lotion  Eucerin Dry Skin Therapy Plus Alpha Hydroxy Crme  Eucerin Dry Skin Therapy Plus Alpha Hydroxy Lotion  Eucerin Original Crme  Eucerin Original Lotion  Eucerin Plus Crme Eucerin Plus Lotion  Eucerin TriLipid Replenishing Lotion  Keri Anti-Bacterial Hand Lotion  Keri Deep Conditioning Original Lotion Dry Skin Formula Softly Scented  Keri Deep Conditioning Original Lotion, Fragrance Free Sensitive Skin Formula  Keri Lotion Fast Absorbing Fragrance Free Sensitive Skin Formula  Keri Lotion Fast Absorbing Softly Scented Dry Skin Formula  Keri Original Lotion  Keri Skin Renewal Lotion Keri Silky Smooth Lotion  Keri Silky Smooth Sensitive Skin Lotion  Nivea Body Creamy Conditioning Oil  Nivea Body Extra Enriched Lotion  Nivea Body Original Lotion  Nivea Body Sheer Moisturizing Lotion Nivea Crme  Nivea Skin Firming Lotion  NutraDerm 30 Skin  Lotion  NutraDerm Skin Lotion  NutraDerm Therapeutic Skin Cream  NutraDerm Therapeutic Skin Lotion  ProShield Protective Hand Cream  Provon moisturizing lotion   PATIENT SIGNATURE_________________________________  NURSE SIGNATURE__________________________________  ________________________________________________________________________    Rogelia Mire  An incentive spirometer is a tool that can help keep your lungs clear and active. This tool measures how well you are filling your lungs with each breath. Taking long deep breaths may help reverse or decrease the chance of developing breathing (pulmonary) problems (especially infection) following: A long period of time when you are unable to move or be active. BEFORE THE PROCEDURE  If the spirometer includes an indicator to show your best effort, your nurse or respiratory therapist will set it to a desired goal. If possible, sit up straight or lean slightly forward. Try not to slouch. Hold the incentive spirometer in an upright position. INSTRUCTIONS FOR USE  Sit on the edge of your bed if possible, or sit up as far as you can in bed or on a chair. Hold the incentive spirometer in an upright position. Breathe out normally. Place the mouthpiece in your mouth and seal your  lips tightly around it. Breathe in slowly and as deeply as possible, raising the piston or the ball toward the top of the column. Hold your breath for 3-5 seconds or for as long as possible. Allow the piston or ball to fall to the bottom of the column. Remove the mouthpiece from your mouth and breathe out normally. Rest for a few seconds and repeat Steps 1 through 7 at least 10 times every 1-2 hours when you are awake. Take your time and take a few normal breaths between deep breaths. The spirometer may include an indicator to show your best effort. Use the indicator as a goal to work toward during each repetition. After each set of 10 deep breaths, practice  coughing to be sure your lungs are clear. If you have an incision (the cut made at the time of surgery), support your incision when coughing by placing a pillow or rolled up towels firmly against it. Once you are able to get out of bed, walk around indoors and cough well. You may stop using the incentive spirometer when instructed by your caregiver.  RISKS AND COMPLICATIONS Take your time so you do not get dizzy or light-headed. If you are in pain, you may need to take or ask for pain medication before doing incentive spirometry. It is harder to take a deep breath if you are having pain. AFTER USE Rest and breathe slowly and easily. It can be helpful to keep track of a log of your progress. Your caregiver can provide you with a simple table to help with this. If you are using the spirometer at home, follow these instructions: SEEK MEDICAL CARE IF:  You are having difficultly using the spirometer. You have trouble using the spirometer as often as instructed. Your pain medication is not giving enough relief while using the spirometer. You develop fever of 100.5 F (38.1 C) or higher. SEEK IMMEDIATE MEDICAL CARE IF:  You cough up bloody sputum that had not been present before. You develop fever of 102 F (38.9 C) or greater. You develop worsening pain at or near the incision site. MAKE SURE YOU:  Understand these instructions. Will watch your condition. Will get help right away if you are not doing well or get worse. Document Released: 05/21/2006 Document Revised: 04/02/2011 Document Reviewed: 07/22/2006 Exit[employer] Patient Information 2014 [employer], [place].   ________________________________________________________________________ WHAT IS A BLOOD TRANSFUSION? Blood Transfusion Information  A transfusion is the replacement of blood or some of its parts. Blood is made up of multiple cells which provide different functions. Red blood cells carry oxygen and are used for blood loss  replacement. White blood cells fight against infection. Platelets control bleeding. Plasma helps clot blood. Other blood products are available for specialized needs, such as hemophilia or other clotting disorders. BEFORE THE TRANSFUSION  Who gives blood for transfusions?  Healthy volunteers who are fully evaluated to make sure their blood is safe. This is blood bank blood. Transfusion therapy is the safest it has ever been in the practice of medicine. Before blood is taken from a donor, a complete history is taken to make sure that person has no history of diseases nor engages in risky social behavior (examples are intravenous drug use or sexual activity with multiple partners). The donor's travel history is screened to minimize risk of transmitting infections, such as malaria. The donated blood is tested for signs of infectious diseases, such as HIV and hepatitis. The blood is then tested to be sure it is compatible  with you in order to minimize the chance of a transfusion reaction. If you or a relative donates blood, this is often done in anticipation of surgery and is not appropriate for emergency situations. It takes many days to process the donated blood. RISKS AND COMPLICATIONS Although transfusion therapy is very safe and saves many lives, the main dangers of transfusion include:  Getting an infectious disease. Developing a transfusion reaction. This is an allergic reaction to something in the blood you were given. Every precaution is taken to prevent this. The decision to have a blood transfusion has been considered carefully by your caregiver before blood is given. Blood is not given unless the benefits outweigh the risks. AFTER THE TRANSFUSION Right after receiving a blood transfusion, you will usually feel much better and more energetic. This is especially true if your red blood cells have gotten low (anemic). The transfusion raises the level of the red blood cells which carry oxygen, and  this usually causes an energy increase. The nurse administering the transfusion will monitor you carefully for complications. HOME CARE INSTRUCTIONS  No special instructions are needed after a transfusion. You may find your energy is better. Speak with your caregiver about any limitations on activity for underlying diseases you may have. SEEK MEDICAL CARE IF:  Your condition is not improving after your transfusion. You develop redness or irritation at the intravenous (IV) site. SEEK IMMEDIATE MEDICAL CARE IF:  Any of the following symptoms occur over the next 12 hours: Shaking chills. You have a temperature by mouth above 102 F (38.9 C), not controlled by medicine. Chest, back, or muscle pain. People around you feel you are not acting correctly or are confused. Shortness of breath or difficulty breathing. Dizziness and fainting. You get a rash or develop hives. You have a decrease in urine output. Your urine turns a dark color or changes to pink, red, or brown. Any of the following symptoms occur over the next 10 days: You have a temperature by mouth above 102 F (38.9 C), not controlled by medicine. Shortness of breath. Weakness after normal activity. The white part of the eye turns yellow (jaundice). You have a decrease in the amount of urine or are urinating less often. Your urine turns a dark color or changes to pink, red, or brown. Document Released: 01/06/2000 Document Revised: 04/02/2011 Document Reviewed: 08/25/2007 [facility] Patient Information 2014 [place], [place].  _______________________________________________________________________

## 2022-08-24 NOTE — Progress Notes (Addendum)
COVID Vaccine Completed:  Yes  Date of COVID positive in last 90 days:  PCP - Sigmund Hazel, MD Cardiologist - Jodelle Red, MD Pulmonologist - Sandrea Hughs, MD (last OV 2019)  Medical clearance on chart dated 08-03-22  Chest x-ray - 05-03-22 Epic EKG - 05-03-22 Epic Stress Test -  ECHO - 03-27-21 Epic Cardiac Cath -  Pacemaker/ICD device last checked: Spinal Cord Stimulator:  Bowel Prep -   Sleep Study -  CPAP -   Fasting Blood Sugar -  Checks Blood Sugar _____ times a day  Last dose of GLP1 agonist-  N/A GLP1 instructions:  N/A   Last dose of SGLT-2 inhibitors-  N/A SGLT-2 instructions: N/A   Blood Thinner Instructions:  Time Aspirin Instructions: Last Dose:  Activity level:  Can go up a flight of stairs and perform activities of daily living without stopping and without symptoms of chest pain or shortness of breath.  Able to exercise without symptoms  Unable to go up a flight of stairs without symptoms of     Anesthesia review:  COPD, PVD and sycope evaluated by cardiology  Patient denies shortness of breath, fever, cough and chest pain at PAT appointment  Patient verbalized understanding of instructions that were given to them at the PAT appointment. Patient was also instructed that they will need to review over the PAT instructions again at home before surgery.

## 2022-08-28 ENCOUNTER — Encounter: Payer: Medicare Other | Admitting: Family

## 2022-08-29 DIAGNOSIS — G8929 Other chronic pain: Secondary | ICD-10-CM | POA: Diagnosis not present

## 2022-08-30 ENCOUNTER — Encounter (HOSPITAL_COMMUNITY)
Admission: RE | Admit: 2022-08-30 | Discharge: 2022-08-30 | Disposition: A | Discharge status: Home / Self Care | Payer: Medicare Other | Source: Ambulatory Visit | Attending: Orthopedic Surgery | Admitting: Orthopedic Surgery

## 2022-08-30 DIAGNOSIS — Z01818 Encounter for other preprocedural examination: Secondary | ICD-10-CM

## 2022-08-30 DIAGNOSIS — I739 Peripheral vascular disease, unspecified: Secondary | ICD-10-CM

## 2022-09-04 DIAGNOSIS — Z758 Other problems related to medical facilities and other health care: Secondary | ICD-10-CM | POA: Diagnosis not present

## 2022-09-12 ENCOUNTER — Ambulatory Visit: Admit: 2022-09-12 | Payer: Medicare Other | Admitting: Orthopedic Surgery

## 2022-09-12 SURGERY — ARTHROPLASTY, HIP, TOTAL, ANTERIOR APPROACH
Anesthesia: Choice | Site: Hip | Laterality: Right

## 2022-09-18 ENCOUNTER — Encounter: Payer: Self-pay | Admitting: Sports Medicine

## 2022-09-18 ENCOUNTER — Ambulatory Visit (INDEPENDENT_AMBULATORY_CARE_PROVIDER_SITE_OTHER): Payer: Medicare Other | Admitting: Sports Medicine

## 2022-09-18 VITALS — BP 118/66 | HR 81 | Temp 97.7°F | Resp 16 | Ht 61.0 in | Wt 140.6 lb

## 2022-09-18 DIAGNOSIS — Z889 Allergy status to unspecified drugs, medicaments and biological substances status: Secondary | ICD-10-CM

## 2022-09-18 NOTE — Progress Notes (Unsigned)
Careteam: Patient Care Team: Venita Sheffield, MD as PCP - General (Internal Medicine) Jodelle Red, MD as PCP - Cardiology (Cardiology)  PLACE OF SERVICE:  [facility]  Advanced Directive information    Allergies  Allergen Reactions   Amoxicillin Nausea And Vomiting   Bactrim Diarrhea and Nausea And Vomiting    Headache severe   Belladonna Alkaloids Nausea And Vomiting   Codeine Nausea And Vomiting   Hydrocodone-Acetaminophen Nausea And Vomiting    migraine   Ibuprofen Nausea And Vomiting   Macrobid [Nitrofurantoin Macrocrystal]     Unknown reaction   Macrolides And Ketolides Nausea And Vomiting   Prednisone     psychosis   Sulfa Antibiotics Diarrhea   Sulfamethoxazole-Trimethoprim Diarrhea   Tramadol     Seizures     Chief Complaint  Patient presents with   Establish Care    New Patient.      HPI: Patient is a 77 y.o. female  is here to establish care Says that she cancelled hip replacement as she has multiple allergies to antibiotics She saw allergy clinic   Review of Systems:  ROS***  Past Medical History:  Diagnosis Date   Allergy    RHINITIS   Arthritis    Asthma    Cancer (HCC)    SKIN   Depression    Headache(784.0)    IBS (irritable bowel syndrome)    Nephrolithiasis    UTI (urinary tract infection)    Past Surgical History:  Procedure Laterality Date   ABDOMINAL HYSTERECTOMY     BREAST SURGERY     BIOPSY.....CYST ASPIRATED   COLON SURGERY     EYE SURGERY Left 2013   lens implant, catarct L   Social History:   reports that she quit smoking about 5 years ago. Her smoking use included cigarettes. She started smoking about 57 years ago. She has a 13 pack-year smoking history. She has never used smokeless tobacco. She reports that she does not drink alcohol and does not use drugs.  Family History  Problem Relation Age of Onset   Cancer Other        SKIN   Asthma Other    Arthritis Other    Hypertension Other     Dementia Mother    Cancer Mother 4       stomach cancer   Dementia Brother    Breast cancer Neg Hx     Medications: Patient's Medications  New Prescriptions   No medications on file  Previous Medications   ACETAMINOPHEN (TYLENOL) 500 MG TABLET    Take 1,000 mg by mouth at bedtime.   ALBUTEROL (VENTOLIN HFA) 108 (90 BASE) MCG/ACT INHALER    Inhale 1-2 puffs into the lungs every 6 (six) hours as needed for wheezing or shortness of breath.   CEFDINIR (OMNICEF) 250 MG/5ML SUSPENSION    Do NOT take. Please bring to your allergy appointment.   CHOLECALCIFEROL (VITAMIN D3) 250 MCG (10000 UT) CAPSULE    Take 10,000 Units by mouth daily.   CYCLOBENZAPRINE (FLEXERIL) 10 MG TABLET    Take 10 mg by mouth at bedtime.   DICYCLOMINE (BENTYL) 10 MG CAPSULE    Take 10 mg by mouth daily as needed for spasms.   MULTIPLE VITAMINS-MINERALS (EMERGEN-C VITAMIN C) PACK    Take 1 packet by mouth daily.   MULTIPLE VITAMINS-MINERALS (HAIR SKIN AND NAILS FORMULA) TABS    Take 2 tablets by mouth daily.   OMEGA-3 FATTY ACIDS (FISH OIL) 1000 MG CAPS  Take 1 capsule by mouth daily.   OVER THE COUNTER MEDICATION    Take 1 capsule by mouth daily. Youth and Tonic supplement   POLYETHYL GLYCOL-PROPYL GLYCOL (SYSTANE OP)    Place 1 drop into both eyes 2 (two) times daily.   SODIUM CHLORIDE (OCEAN) 0.65 % SOLN NASAL SPRAY    Place 1 spray into both nostrils as needed for congestion.  Modified Medications   No medications on file  Discontinued Medications   No medications on file    Physical Exam:  There were no vitals filed for this visit. There is no height or weight on file to calculate BMI. Wt Readings from Last 3 Encounters:  08/24/22 140 lb 4.8 oz (63.6 kg)  05/03/22 139 lb (63 kg)  03/17/21 150 lb 6.4 oz (68.2 kg)    Physical Exam***  Labs reviewed: Basic Metabolic Panel: Recent Labs    05/03/22 1309  NA 137  K 3.8  CL 100  CO2 26  GLUCOSE 91  BUN 17  CREATININE 0.86  CALCIUM 8.9   Liver  Function Tests: Recent Labs    05/03/22 1309  AST 20  ALT 16  ALKPHOS 58  BILITOT 0.6  PROT 6.9  ALBUMIN 3.3*   No results for input(s): "LIPASE", "AMYLASE" in the last 8760 hours. No results for input(s): "AMMONIA" in the last 8760 hours. CBC: Recent Labs    05/03/22 1309  WBC 7.0  NEUTROABS 4.2  HGB 12.4  HCT 38.0  MCV 93.1  PLT 360   Lipid Panel: No results for input(s): "CHOL", "HDL", "LDLCALC", "TRIG", "CHOLHDL", "LDLDIRECT" in the last 8760 hours. TSH: No results for input(s): "TSH" in the last 8760 hours. A1C: No results found for: "HGBA1C"   Assessment/Plan There are no diagnoses linked to this encounter.

## 2022-09-19 ENCOUNTER — Encounter: Payer: Self-pay | Admitting: Sports Medicine

## 2022-09-25 DIAGNOSIS — Z6827 Body mass index (BMI) 27.0-27.9, adult: Secondary | ICD-10-CM | POA: Diagnosis not present

## 2022-09-25 DIAGNOSIS — K589 Irritable bowel syndrome without diarrhea: Secondary | ICD-10-CM | POA: Diagnosis not present

## 2022-09-25 DIAGNOSIS — M1611 Unilateral primary osteoarthritis, right hip: Secondary | ICD-10-CM | POA: Diagnosis not present

## 2022-10-01 DIAGNOSIS — Z889 Allergy status to unspecified drugs, medicaments and biological substances status: Secondary | ICD-10-CM | POA: Diagnosis not present

## 2023-01-30 DIAGNOSIS — L570 Actinic keratosis: Secondary | ICD-10-CM | POA: Diagnosis not present

## 2023-01-30 DIAGNOSIS — Z889 Allergy status to unspecified drugs, medicaments and biological substances status: Secondary | ICD-10-CM | POA: Diagnosis not present

## 2023-01-30 DIAGNOSIS — J4520 Mild intermittent asthma, uncomplicated: Secondary | ICD-10-CM | POA: Diagnosis not present

## 2023-01-30 DIAGNOSIS — Z6828 Body mass index (BMI) 28.0-28.9, adult: Secondary | ICD-10-CM | POA: Diagnosis not present

## 2023-02-26 DIAGNOSIS — T50905A Adverse effect of unspecified drugs, medicaments and biological substances, initial encounter: Secondary | ICD-10-CM | POA: Diagnosis not present

## 2023-02-26 DIAGNOSIS — M169 Osteoarthritis of hip, unspecified: Secondary | ICD-10-CM | POA: Diagnosis not present

## 2023-02-26 DIAGNOSIS — Z881 Allergy status to other antibiotic agents status: Secondary | ICD-10-CM | POA: Diagnosis not present

## 2023-03-06 DIAGNOSIS — R399 Unspecified symptoms and signs involving the genitourinary system: Secondary | ICD-10-CM | POA: Diagnosis not present

## 2023-04-22 NOTE — H&P (Addendum)
 TOTAL HIP ADMISSION H&P  Patient is admitted for right total hip arthroplasty.  Subjective:  Chief Complaint: Right hip pain  HPI: Penny Lamb, 78 y.o. female, has a history of pain and functional disability in the right hip due to arthritis and patient has failed non-surgical conservative treatments for greater than 12 weeks to include NSAID's and/or analgesics and activity modification. Onset of symptoms was gradual, starting  several  years ago with gradually worsening course since that time. The patient noted no past surgery on the right hip. Patient currently rates pain in the right hip at 9 out of 10 with activity. Patient has night pain, worsening of pain with activity and weight bearing, pain with passive range of motion, and crepitus. Patient has evidence of  bone-on-bone arthritis in the right hip with subchondral cysts and marginal osteophytes  by imaging studies. This condition presents safety issues increasing the risk of falls. There is no current active infection.  Patient Active Problem List   Diagnosis Date Noted   COPD GOLD II 03/11/2017   Drug allergy 10/20/2014   Genital herpes 12/31/2012   Rib pain on right side 01/09/2012   VAGINITIS, ATROPHIC 02/28/2010   Cystitis 02/21/2010   HEMATURIA UNSPECIFIED 02/21/2010   FOOT PAIN, LEFT 10/21/2009   DEPRESSION 09/14/2009   Allergic rhinitis 09/14/2009   Asthma 09/14/2009   ARTIFICIAL MENOPAUSE 09/14/2009   Osteoarthritis 09/14/2009   Headache 09/14/2009   SKIN CANCER, HX OF 09/14/2009   NEPHROLITHIASIS, HX OF 09/14/2009    Past Medical History:  Diagnosis Date   Allergy    RHINITIS   Arthritis    Asthma    Cancer (HCC)    SKIN   Depression    Headache(784.0)    IBS (irritable bowel syndrome)    Nephrolithiasis    UTI (urinary tract infection)     Past Surgical History:  Procedure Laterality Date   ABDOMINAL HYSTERECTOMY  1972   BREAST SURGERY     BIOPSY.....CYST ASPIRATED   COLON SURGERY      EYE SURGERY Left 01/23/2011   lens implant, catarct L   OTHER SURGICAL HISTORY  1966   Sacral Colpohysteropexy   OVARIAN CYST SURGERY  1986    Prior to Admission medications   Medication Sig Start Date End Date Taking? Authorizing Provider  acetaminophen  (TYLENOL ) 500 MG tablet Take 1,000 mg by mouth at bedtime.    [provider]  albuterol  (VENTOLIN  HFA) 108 (90 Base) MCG/ACT inhaler Inhale 1-2 puffs into the lungs every 6 (six) hours as needed for wheezing or shortness of breath. 03/11/19   [provider]  Cholecalciferol (VITAMIN D3) 250 MCG (10000 UT) capsule Take 10,000 Units by mouth daily.    [provider]  cyclobenzaprine (FLEXERIL) 10 MG tablet Take 10 mg by mouth at bedtime.    [provider]  dicyclomine  (BENTYL ) 10 MG capsule Take 10 mg by mouth daily as needed for spasms.    [provider]  Multiple Vitamins-Minerals (EMERGEN-C VITAMIN C) PACK Take 1 packet by mouth daily.    [provider]  Multiple Vitamins-Minerals (HAIR SKIN AND NAILS FORMULA) TABS Take 2 tablets by mouth daily.    [provider]  Omega-3 Fatty Acids (FISH OIL) 1000 MG CAPS Take 1 capsule by mouth daily.    [provider]  OVER THE COUNTER MEDICATION Take 1 capsule by mouth daily. Youth and Tonic supplement    [provider]  Polyethyl Glycol-Propyl Glycol (SYSTANE OP) Place 1 drop  into both eyes 2 (two) times daily.    [provider]    Allergies  Allergen Reactions   Amoxicillin Nausea And Vomiting   Bactrim  Diarrhea and Nausea And Vomiting    Headache severe   Belladonna Alkaloids Nausea And Vomiting   Codeine Nausea And Vomiting   Corticosteroids    Hydrocodone -Acetaminophen  Nausea And Vomiting    migraine   Ibuprofen Nausea And Vomiting   Macrobid  [Nitrofurantoin  Macrocrystal]     Unknown reaction   Macrobid  [Nitrofurantoin ] Diarrhea   Macrolides And Ketolides Nausea And Vomiting   Pneumococcal  20-Val Conj Vacc     URI   Prednisone     psychosis   Sulfa  Antibiotics Diarrhea   Sulfamethoxazole -Trimethoprim  Diarrhea   Tramadol      Seizures     Social History   Socioeconomic History   Marital status: Single    Spouse name: Not on file   Number of children: Not on file   Years of education: Not on file   Highest education level: Not on file  Occupational History   Occupation: retired    Associate Professor: RETIRED  Tobacco Use   Smoking status: Former    Current packs/day: 0.00    Average packs/day: 0.3 packs/day for 52.0 years (13.0 ttl pk-yrs)    Types: Cigarettes    Start date: 01/22/1965    Quit date: 01/22/2017    Years since quitting: 6.2   Smokeless tobacco: Never  Vaping Use   Vaping status: Never Used  Substance and Sexual Activity   Alcohol use: No    Alcohol/week: 0.0 standard drinks of alcohol   Drug use: Not Currently    Types: Marijuana   Sexual activity: Not Currently    Partners: Male  Other Topics Concern   Not on file  Social History Narrative   GETS REG EXERCISE   Social Drivers of Health   Financial Resource Strain: Low Risk  (08/02/2022)   Received from [facility]   Overall Financial Resource Strain (CARDIA)    Difficulty of Paying Living Expenses: Not very hard  Food Insecurity: No Food Insecurity (08/02/2022)   Received from [facility]   Hunger Vital Sign    Worried About Running Out of Food in the Last Year: Never true    Ran Out of Food in the Last Year: Never true  Transportation Needs: No Transportation Needs (08/02/2022)   Received from [facility] - Transportation    Lack of Transportation (Medical): No    Lack of Transportation (Non-Medical): No  Physical Activity: Unknown (08/02/2022)   Received from [facility]   Exercise Vital Sign    Days of Exercise per Week: Patient declined    Minutes of Exercise per Session: Not on file  Stress: No Stress Concern Present (08/02/2022)   Received from [facility] - Occupational Stress Questionnaire    Feeling of Stress : Only a little  Social Connections: Moderately Integrated (08/02/2022)   Received from [facility]   Social Network    How would you rate your social network (family, work, friends)?: Adequate participation with social networks  Intimate Partner Violence: Unknown (06/04/2022)   Received from [facility]   HITS    Physically Hurt: Not on file    Insult or Talk Down To: Not on file    Threaten Physical Harm: Not on file    Scream or Curse: Not on file    Tobacco Use:  Low Risk  (02/26/2023)   Received from [facility]   Patient History    Smoking Tobacco Use: Never    Smokeless Tobacco Use: Never    Passive Exposure: Not on file   Social History   Substance and Sexual Activity  Alcohol Use No   Alcohol/week: 0.0 standard drinks of alcohol    Family History  Problem Relation Age of Onset   Cancer Mother 66       stomach cancer   Dementia Mother    Dementia Brother    Cancer Other        SKIN   Asthma Other    Arthritis Other    Hypertension Other    Breast cancer Neg Hx     Review of Systems  Constitutional:  Negative for chills and fever.  HENT:  Negative for congestion, sore throat and tinnitus.   Eyes:  Negative for double vision, photophobia and pain.  Respiratory:  Negative for cough, shortness of breath and wheezing.   Cardiovascular:  Negative for chest pain, palpitations and orthopnea.  Gastrointestinal:  Negative for heartburn, nausea and vomiting.  Genitourinary:  Negative for dysuria, frequency and urgency.  Musculoskeletal:  Positive for joint pain.  Neurological:  Negative for dizziness, weakness and headaches.     Objective:  Physical Exam: Well nourished and well developed.  General: Alert and oriented x3, cooperative and pleasant, no acute distress.  Head: normocephalic, atraumatic, neck supple.  Eyes: EOMI.   Musculoskeletal:  Evaluation of right hip Flexion 100, no internal rotation, only about 20 external rotation, 20 abduction. Nontender over the greater trochanter.  - Left hip has normal range of motion.  - She has an antalgic gait pattern on the right.   Calves soft and nontender. Motor function intact in LE. Strength 5/5 LE bilaterally. Neuro: Distal pulses 2+. Sensation to light touch intact in LE.  Imaging Review Plain radiographs demonstrate severe degenerative joint disease of the right hip. The bone quality appears to be adequate for age and reported activity level.  Assessment/Plan:  End stage arthritis, right hip  The patient history, physical examination, clinical judgement of the provider and imaging studies are consistent with end stage degenerative joint disease of the right hip and total hip arthroplasty is deemed medically necessary. The treatment options including medical management, injection therapy, arthroscopy and arthroplasty were discussed at length. The risks and benefits of total hip arthroplasty were presented and reviewed. The risks due to aseptic loosening, infection, stiffness, dislocation/subluxation, thromboembolic complications and other imponderables were discussed. The patient acknowledged the explanation, agreed to proceed with the plan and consent was signed. Patient is being admitted for inpatient treatment for surgery, pain control, PT, OT, prophylactic antibiotics, VTE prophylaxis, progressive ambulation and ADLs and discharge planning.The patient is planning to be discharged  home .  Patient's anticipated LOS is less than 2 midnights, meeting these requirements: - Younger than 75 - Lives within 1 hour of care - Has a competent adult at home to recover with post-op recover - NO history of  - Chronic pain requiring opiods  - Diabetes  - Coronary Artery Disease  - Heart failure  - Heart attack  - Stroke  - Cardiac arrhythmia  - Respiratory  Failure/COPD  - Renal failure  - Anemia  - Advanced Liver disease  Therapy Plans: HEP Disposition: Home with daughter Planned DVT Prophylaxis: Eliquis 2.5 mg BID DME Needed: [facility] PCP: Perley Bradley, MD (clearance received) TXA: IV Allergies: Several Anesthesia Concerns: None  BMI: 26.2 Last HgbA1c: Not diabetic Pain Regimen: Dilaudid (+/-, will ask Dr. France Ina on day of surgery), gabapentin (limited on what meds she can take), flexeril Pharmacy: [facility] (have brought to room)  Other: - Patient states only antibiotic she can take is ciprofloxacin per allergy testing   - Patient was instructed on what medications to stop prior to surgery. - Follow-up visit in 2 weeks with Dr. France Ina - Begin physical therapy following surgery - Pre-operative lab work as pre-surgical testing - Prescriptions will be provided in hospital at time of discharge  Sharlynn Dear, PA-C Orthopedic Surgery [facility] [place]

## 2023-04-29 DIAGNOSIS — M1611 Unilateral primary osteoarthritis, right hip: Secondary | ICD-10-CM | POA: Diagnosis not present

## 2023-04-29 DIAGNOSIS — Z01818 Encounter for other preprocedural examination: Secondary | ICD-10-CM | POA: Diagnosis not present

## 2023-05-08 ENCOUNTER — Encounter (HOSPITAL_COMMUNITY): Payer: Self-pay

## 2023-05-08 NOTE — Patient Instructions (Addendum)
 SURGICAL WAITING ROOM VISITATION Patients having surgery or a procedure may have no more than 2 support people in the waiting area - these visitors may rotate.    Children under the age of 28 must have an adult with them who is not the patient.  If the patient needs to stay at the hospital during part of their recovery, the visitor guidelines for inpatient rooms apply. Pre-op nurse will coordinate an appropriate time for 1 support person to accompany patient in pre-op.  This support person may not rotate.    Please refer to the [facility] website for the visitor guidelines for Inpatients (after your surgery is over and you are in a regular room).       Your procedure is scheduled on: Wednesday, 05-22-23   Report to [facility] Main Entrance    Report to admitting at 11:30 AM   Call this number if you have problems the morning of surgery [phone]   Do not eat food :After Midnight.   After Midnight you may have the following liquids until 11:00 AM DAY OF SURGERY  Water Non-Citrus Juices (without pulp, NO RED-Apple, White grape, White cranberry) Black Coffee (NO MILK/CREAM OR CREAMERS, sugar ok)  Clear Tea (NO MILK/CREAM OR CREAMERS, sugar ok) regular and decaf                             Plain Jell-O (NO RED)                                           Fruit ices (not with fruit pulp, NO RED)                                     Popsicles (NO RED)                                                               Sports drinks like Gatorade (NO RED)                   The day of surgery:  Drink ONE (1) Pre-Surgery Clear Ensure by 11:00 AM the morning of surgery. Drink in one sitting. Do not sip.  This drink was given to you during your hospital  pre-op appointment visit. Nothing else to drink after completing the Pre-Surgery Clear Ensure.          If you have questions, please contact your surgeon's office.   FOLLOW ANY ADDITIONAL PRE OP INSTRUCTIONS YOU RECEIVED FROM  YOUR SURGEON'S OFFICE!!!     Oral Hygiene is also important to reduce your risk of infection.                                    Remember - BRUSH YOUR TEETH THE MORNING OF SURGERY WITH YOUR REGULAR TOOTHPASTE   Do NOT smoke after Midnight   Take these medicines the morning of surgery:   Gabapentin,Tylenol  if needed,  You may use your Eye drops  if needed.    Okay to use inhalers, Please bring your Albuterol  Inhaler with you on the surgery day.   Stop all vitamins and herbal supplements 7 days before surgery  Bring CPAP mask and tubing day of surgery.                              You may not have any metal on your body including hair pins, jewelry, and body piercing             Do not wear make-up, lotions, powders, perfumes or deodorant  Do not wear nail polish including gel and S&S, artificial/acrylic nails, or any other type of covering on natural nails including finger and toenails. If you have artificial nails, gel coating, etc. that needs to be removed by a nail salon please have this removed prior to surgery or surgery may need to be canceled/ delayed if the surgeon/ anesthesia feels like they are unable to be safely monitored.   Do not shave  48 hours prior to surgery.    Do not bring valuables to the hospital. [facility] IS NOT RESPONSIBLE   FOR VALUABLES.   Contacts, dentures or bridgework may not be worn into surgery.   Bring small overnight bag day of surgery.   DO NOT BRING YOUR HOME MEDICATIONS TO THE HOSPITAL. PHARMACY WILL DISPENSE MEDICATIONS LISTED ON YOUR MEDICATION LIST TO YOU DURING YOUR ADMISSION IN THE HOSPITAL!    Special Instructions: Bring a copy of your healthcare power of attorney and living will documents the day of surgery if you haven't scanned them before.              Please read over the following fact sheets you were given: IF YOU HAVE QUESTIONS ABOUT YOUR PRE-OP INSTRUCTIONS PLEASE CALL [phone] Aden Agreste    Pre-operative 5 CHG Bath Instructions    You can play a key role in reducing the risk of infection after surgery. Your skin needs to be as free of germs as possible. You can reduce the number of germs on your skin by washing with CHG (chlorhexidine gluconate) soap before surgery. CHG is an antiseptic soap that kills germs and continues to kill germs even after washing.   DO NOT use if you have an allergy to chlorhexidine/CHG or antibacterial soaps. If your skin becomes reddened or irritated, stop using the CHG and notify one of our RNs at [phone].   Please shower with the CHG soap starting 4 days before surgery using the following schedule: SATURDAY  05-18-2023    Please keep in mind the following:  DO NOT shave, including legs and underarms, starting the day of your first shower.   You may shave your face at any point before/day of surgery.  Place clean sheets on your bed the day you start using CHG soap. Use a clean washcloth (not used since being washed) for each shower. DO NOT sleep with pets once you start using the CHG.   CHG Shower Instructions:  If you choose to wash your hair and private area, wash first with your normal shampoo/soap.  After you use shampoo/soap, rinse your hair and body thoroughly to remove shampoo/soap residue.  Turn the water OFF and apply about 3 tablespoons (45 ml) of CHG soap to a CLEAN washcloth.  Apply CHG soap ONLY FROM YOUR NECK DOWN TO YOUR TOES (washing for 3-5 minutes)  DO NOT use CHG soap on face, private areas,  open wounds, or sores.  Pay special attention to the area where your surgery is being performed.  If you are having back surgery, having someone wash your back for you may be helpful. Wait 2 minutes after CHG soap is applied, then you may rinse off the CHG soap.  Pat dry with a clean towel  Put on clean clothes/pajamas   If you choose to wear lotion, please use ONLY the CHG-compatible lotions on the back of this paper.     Additional instructions for the day of surgery: DO  NOT APPLY any lotions, deodorants, cologne, or perfumes.   Put on clean/comfortable clothes.  Brush your teeth.  Ask your nurse before applying any prescription medications to the skin.      CHG Compatible Lotions   Aveeno Moisturizing lotion  Cetaphil Moisturizing Cream  Cetaphil Moisturizing Lotion  Clairol Herbal Essence Moisturizing Lotion, Dry Skin  Clairol Herbal Essence Moisturizing Lotion, Extra Dry Skin  Clairol Herbal Essence Moisturizing Lotion, Normal Skin  Curel Age Defying Therapeutic Moisturizing Lotion with Alpha Hydroxy  Curel Extreme Care Body Lotion  Curel Soothing Hands Moisturizing Hand Lotion  Curel Therapeutic Moisturizing Cream, Fragrance-Free  Curel Therapeutic Moisturizing Lotion, Fragrance-Free  Curel Therapeutic Moisturizing Lotion, Original Formula  Eucerin Daily Replenishing Lotion  Eucerin Dry Skin Therapy Plus Alpha Hydroxy Crme  Eucerin Dry Skin Therapy Plus Alpha Hydroxy Lotion  Eucerin Original Crme  Eucerin Original Lotion  Eucerin Plus Crme Eucerin Plus Lotion  Eucerin TriLipid Replenishing Lotion  Keri Anti-Bacterial Hand Lotion  Keri Deep Conditioning Original Lotion Dry Skin Formula Softly Scented  Keri Deep Conditioning Original Lotion, Fragrance Free Sensitive Skin Formula  Keri Lotion Fast Absorbing Fragrance Free Sensitive Skin Formula  Keri Lotion Fast Absorbing Softly Scented Dry Skin Formula  Keri Original Lotion  Keri Skin Renewal Lotion Keri Silky Smooth Lotion  Keri Silky Smooth Sensitive Skin Lotion  Nivea Body Creamy Conditioning Oil  Nivea Body Extra Enriched Lotion  Nivea Body Original Lotion  Nivea Body Sheer Moisturizing Lotion Nivea Crme  Nivea Skin Firming Lotion  NutraDerm 30 Skin Lotion  NutraDerm Skin Lotion  NutraDerm Therapeutic Skin Cream  NutraDerm Therapeutic Skin Lotion  ProShield Protective Hand Cream  Provon moisturizing lotion   PATIENT SIGNATURE_________________________________  NURSE  SIGNATURE__________________________________  ________________________________________________________________________    Benjamen Brand  An incentive spirometer is a tool that can help keep your lungs clear and active. This tool measures how well you are filling your lungs with each breath. Taking long deep breaths may help reverse or decrease the chance of developing breathing (pulmonary) problems (especially infection) following: A long period of time when you are unable to move or be active. BEFORE THE PROCEDURE  If the spirometer includes an indicator to show your best effort, your nurse or respiratory therapist will set it to a desired goal. If possible, sit up straight or lean slightly forward. Try not to slouch. Hold the incentive spirometer in an upright position. INSTRUCTIONS FOR USE  Sit on the edge of your bed if possible, or sit up as far as you can in bed or on a chair. Hold the incentive spirometer in an upright position. Breathe out normally. Place the mouthpiece in your mouth and seal your lips tightly around it. Breathe in slowly and as deeply as possible, raising the piston or the ball toward the top of the column. Hold your breath for 3-5 seconds or for as long as possible. Allow the piston or ball to fall to the bottom of  the column. Remove the mouthpiece from your mouth and breathe out normally. Rest for a few seconds and repeat Steps 1 through 7 at least 10 times every 1-2 hours when you are awake. Take your time and take a few normal breaths between deep breaths. The spirometer may include an indicator to show your best effort. Use the indicator as a goal to work toward during each repetition. After each set of 10 deep breaths, practice coughing to be sure your lungs are clear. If you have an incision (the cut made at the time of surgery), support your incision when coughing by placing a pillow or rolled up towels firmly against it. Once you are able to get out of  bed, walk around indoors and cough well. You may stop using the incentive spirometer when instructed by your caregiver.  RISKS AND COMPLICATIONS Take your time so you do not get dizzy or light-headed. If you are in pain, you may need to take or ask for pain medication before doing incentive spirometry. It is harder to take a deep breath if you are having pain. AFTER USE Rest and breathe slowly and easily. It can be helpful to keep track of a log of your progress. Your caregiver can provide you with a simple table to help with this. If you are using the spirometer at home, follow these instructions: SEEK MEDICAL CARE IF:  You are having difficultly using the spirometer. You have trouble using the spirometer as often as instructed. Your pain medication is not giving enough relief while using the spirometer. You develop fever of 100.5 F (38.1 C) or higher. SEEK IMMEDIATE MEDICAL CARE IF:  You cough up bloody sputum that had not been present before. You develop fever of 102 F (38.9 C) or greater. You develop worsening pain at or near the incision site. MAKE SURE YOU:  Understand these instructions. Will watch your condition. Will get help right away if you are not doing well or get worse. Document Released: 05/21/2006 Document Revised: 04/02/2011 Document Reviewed: 07/22/2006 ExitCare Patient Information 2014 ExitCare, [place].   ________________________________________________________________________ WHAT IS A BLOOD TRANSFUSION? Blood Transfusion Information  A transfusion is the replacement of blood or some of its parts. Blood is made up of multiple cells which provide different functions. Red blood cells carry oxygen and are used for blood loss replacement. White blood cells fight against infection. Platelets control bleeding. Plasma helps clot blood. Other blood products are available for specialized needs, such as hemophilia or other clotting disorders. BEFORE THE TRANSFUSION   Who gives blood for transfusions?  Healthy volunteers who are fully evaluated to make sure their blood is safe. This is blood bank blood. Transfusion therapy is the safest it has ever been in the practice of medicine. Before blood is taken from a donor, a complete history is taken to make sure that person has no history of diseases nor engages in risky social behavior (examples are intravenous drug use or sexual activity with multiple partners). The donor's travel history is screened to minimize risk of transmitting infections, such as malaria. The donated blood is tested for signs of infectious diseases, such as HIV and hepatitis. The blood is then tested to be sure it is compatible with you in order to minimize the chance of a transfusion reaction. If you or a relative donates blood, this is often done in anticipation of surgery and is not appropriate for emergency situations. It takes many days to process the donated blood. RISKS AND COMPLICATIONS Although  transfusion therapy is very safe and saves many lives, the main dangers of transfusion include:  Getting an infectious disease. Developing a transfusion reaction. This is an allergic reaction to something in the blood you were given. Every precaution is taken to prevent this. The decision to have a blood transfusion has been considered carefully by your caregiver before blood is given. Blood is not given unless the benefits outweigh the risks. AFTER THE TRANSFUSION Right after receiving a blood transfusion, you will usually feel much better and more energetic. This is especially true if your red blood cells have gotten low (anemic). The transfusion raises the level of the red blood cells which carry oxygen, and this usually causes an energy increase. The nurse administering the transfusion will monitor you carefully for complications. HOME CARE INSTRUCTIONS  No special instructions are needed after a transfusion. You may find your energy is  better. Speak with your caregiver about any limitations on activity for underlying diseases you may have. SEEK MEDICAL CARE IF:  Your condition is not improving after your transfusion. You develop redness or irritation at the intravenous (IV) site. SEEK IMMEDIATE MEDICAL CARE IF:  Any of the following symptoms occur over the next 12 hours: Shaking chills. You have a temperature by mouth above 102 F (38.9 C), not controlled by medicine. Chest, back, or muscle pain. People around you feel you are not acting correctly or are confused. Shortness of breath or difficulty breathing. Dizziness and fainting. You get a rash or develop hives. You have a decrease in urine output. Your urine turns a dark color or changes to pink, red, or brown. Any of the following symptoms occur over the next 10 days: You have a temperature by mouth above 102 F (38.9 C), not controlled by medicine. Shortness of breath. Weakness after normal activity. The white part of the eye turns yellow (jaundice). You have a decrease in the amount of urine or are urinating less often. Your urine turns a dark color or changes to pink, red, or brown. Document Released: 01/06/2000 Document Revised: 04/02/2011 Document Reviewed: 08/25/2007 ExitCare Patient Information 2014 [employer].  _______________________________________________________________________           View Pre-Surgery Education Videos:  [email or web address]

## 2023-05-12 NOTE — Progress Notes (Signed)
 COVID Vaccine received:  []  No [x]  Yes Date of any COVID positive Test in last 90 days:  PCP - Tye Gall, MD    Perley Bradley, MD  Cardiologist - Sheryle Donning, MD  Pulmonology- Vernestine Gondola, MD  (LOV 2019)  Chest x-ray - 05-03-2022  1v  Epic EKG -  05-03-2022   will repeat Stress Test -  ECHO - 03-27-2021  Epic Cardiac Cath -   Pacemaker / ICD device [x]  No []  Yes   Spinal Cord Stimulator:[x]  No []  Yes       History of Sleep Apnea? []  No [x]  Yes   CPAP used?- []  No []  Yes    Does the patient monitor blood sugar?   [x]  N/A   []  No []  Yes  Patient has: [x]  NO Hx DM   []  Pre-DM   []  DM1  []   DM2 Last A1c was:5.3 on   08-03-2022    Blood Thinner / Instructions:   Aspirin Instructions:  ERAS Protocol Ordered: []  No  [x]  Yes PRE-SURGERY [x]  ENSURE  []  G2  Patient is to be NPO after: 11:00  Dental hx: []  Dentures:  []  N/A      []  Bridge or Partial:                   []  Loose or Damaged teeth:   Comments: Patient was given the 5 CHG shower / bath instructions for THA Shoulder arthroplasty surgery along with 2 bottles of the CHG soap. Patient will start this on: 05-18-2023             Activity level: Patient is able / unable to climb a flight of stairs without difficulty; []  No CP  []  No SOB, but would have ___   Patient can / can not perform ADLs without assistance.   Anesthesia review: OSA-   , COPD, PVD, Syncope, depression.   Patient denies shortness of breath, fever, cough and chest pain at PAT appointment.  Patient verbalized understanding and agreement to the Pre-Surgical Instructions that were given to them at this PAT appointment. Patient was also educated of the need to review these PAT instructions again prior to her surgery.I reviewed the appropriate phone numbers to call if they have any and questions or concerns.

## 2023-05-13 ENCOUNTER — Other Ambulatory Visit: Payer: Self-pay

## 2023-05-13 ENCOUNTER — Encounter (HOSPITAL_COMMUNITY): Payer: Self-pay

## 2023-05-13 ENCOUNTER — Encounter (HOSPITAL_COMMUNITY)
Admission: RE | Admit: 2023-05-13 | Discharge: 2023-05-13 | Disposition: A | Discharge status: Home / Self Care | Source: Ambulatory Visit | Attending: Orthopedic Surgery | Admitting: Orthopedic Surgery

## 2023-05-13 VITALS — BP 126/66 | HR 68 | Temp 98.4°F | Resp 18 | Ht 60.0 in | Wt 145.0 lb

## 2023-05-13 DIAGNOSIS — Z01818 Encounter for other preprocedural examination: Secondary | ICD-10-CM

## 2023-05-13 DIAGNOSIS — J449 Chronic obstructive pulmonary disease, unspecified: Secondary | ICD-10-CM | POA: Diagnosis not present

## 2023-05-13 DIAGNOSIS — Z01812 Encounter for preprocedural laboratory examination: Secondary | ICD-10-CM | POA: Diagnosis not present

## 2023-05-13 DIAGNOSIS — N289 Disorder of kidney and ureter, unspecified: Secondary | ICD-10-CM | POA: Insufficient documentation

## 2023-05-13 HISTORY — DX: Personal history of urinary calculi: Z87.442

## 2023-05-13 HISTORY — DX: Other complications of anesthesia, initial encounter: T88.59XA

## 2023-05-13 HISTORY — DX: Pneumonia, unspecified organism: J18.9

## 2023-05-13 HISTORY — DX: Chronic obstructive pulmonary disease, unspecified: J44.9

## 2023-05-13 LAB — CBC
HCT: 39.7 % (ref 36.0–46.0)
Hemoglobin: 12.4 g/dL (ref 12.0–15.0)
MCH: 30.5 pg (ref 26.0–34.0)
MCHC: 31.2 g/dL (ref 30.0–36.0)
MCV: 97.5 fL (ref 80.0–100.0)
Platelets: 280 10*3/uL (ref 150–400)
RBC: 4.07 MIL/uL (ref 3.87–5.11)
RDW: 13.7 % (ref 11.5–15.5)
WBC: 6.6 10*3/uL (ref 4.0–10.5)
nRBC: 0 % (ref 0.0–0.2)

## 2023-05-13 LAB — BASIC METABOLIC PANEL WITH GFR
Anion gap: 7 (ref 5–15)
BUN: 30 mg/dL — ABNORMAL HIGH (ref 8–23)
CO2: 26 mmol/L (ref 22–32)
Calcium: 9.3 mg/dL (ref 8.9–10.3)
Chloride: 104 mmol/L (ref 98–111)
Creatinine, Ser: 0.6 mg/dL (ref 0.44–1.00)
GFR, Estimated: >60 mL/min (ref >60)
Glucose, Bld: 94 mg/dL (ref 70–99)
Potassium: 3.8 mmol/L (ref 3.5–5.1)
Sodium: 137 mmol/L (ref 135–145)

## 2023-05-13 LAB — SURGICAL PCR SCREEN
MRSA, PCR: NEGATIVE
Staphylococcus aureus: NEGATIVE

## 2023-05-16 DIAGNOSIS — K080 Exfoliation of teeth due to systemic causes: Secondary | ICD-10-CM | POA: Diagnosis not present

## 2023-05-17 ENCOUNTER — Encounter (HOSPITAL_COMMUNITY): Payer: Self-pay

## 2023-05-22 ENCOUNTER — Encounter (HOSPITAL_COMMUNITY): Payer: Self-pay | Admitting: Orthopedic Surgery

## 2023-05-22 ENCOUNTER — Other Ambulatory Visit: Payer: Self-pay

## 2023-05-22 ENCOUNTER — Ambulatory Visit (HOSPITAL_COMMUNITY): Admitting: Anesthesiology

## 2023-05-22 ENCOUNTER — Observation Stay (HOSPITAL_COMMUNITY)

## 2023-05-22 ENCOUNTER — Ambulatory Visit (HOSPITAL_COMMUNITY)

## 2023-05-22 ENCOUNTER — Encounter (HOSPITAL_COMMUNITY)
Admission: RE | Disposition: A | Discharge status: Home / Self Care | Payer: Self-pay | Source: Home / Self Care | Attending: Orthopedic Surgery

## 2023-05-22 ENCOUNTER — Observation Stay (HOSPITAL_COMMUNITY)
Admission: RE | Admit: 2023-05-22 | Discharge: 2023-05-23 | Disposition: A | Discharge status: Home / Self Care | Payer: Medicare Other | Attending: Orthopedic Surgery | Admitting: Orthopedic Surgery

## 2023-05-22 DIAGNOSIS — Z87891 Personal history of nicotine dependence: Secondary | ICD-10-CM | POA: Diagnosis not present

## 2023-05-22 DIAGNOSIS — E785 Hyperlipidemia, unspecified: Secondary | ICD-10-CM

## 2023-05-22 DIAGNOSIS — J449 Chronic obstructive pulmonary disease, unspecified: Secondary | ICD-10-CM | POA: Insufficient documentation

## 2023-05-22 DIAGNOSIS — J189 Pneumonia, unspecified organism: Secondary | ICD-10-CM | POA: Diagnosis not present

## 2023-05-22 DIAGNOSIS — Z79899 Other long term (current) drug therapy: Secondary | ICD-10-CM | POA: Diagnosis not present

## 2023-05-22 DIAGNOSIS — M1611 Unilateral primary osteoarthritis, right hip: Principal | ICD-10-CM | POA: Diagnosis present

## 2023-05-22 DIAGNOSIS — Z85828 Personal history of other malignant neoplasm of skin: Secondary | ICD-10-CM | POA: Diagnosis not present

## 2023-05-22 DIAGNOSIS — Z471 Aftercare following joint replacement surgery: Secondary | ICD-10-CM | POA: Diagnosis not present

## 2023-05-22 DIAGNOSIS — M169 Osteoarthritis of hip, unspecified: Principal | ICD-10-CM | POA: Diagnosis present

## 2023-05-22 DIAGNOSIS — Z96641 Presence of right artificial hip joint: Secondary | ICD-10-CM | POA: Diagnosis not present

## 2023-05-22 LAB — TYPE AND SCREEN
ABO/RH(D): A POS
Antibody Screen: NEGATIVE

## 2023-05-22 LAB — ABO/RH: ABO/RH(D): A POS

## 2023-05-22 SURGERY — ARTHROPLASTY, HIP, TOTAL, ANTERIOR APPROACH
Anesthesia: Monitor Anesthesia Care | Site: Hip | Laterality: Right

## 2023-05-22 MED ORDER — CYCLOBENZAPRINE HCL 10 MG PO TABS: 10.0000 mg | ORAL_TABLET | Freq: Three times a day (TID) | ORAL | Status: DC | PRN

## 2023-05-22 MED ORDER — FENTANYL CITRATE PF 50 MCG/ML IJ SOSY: 25.0000 ug | PREFILLED_SYRINGE | INTRAMUSCULAR | Status: DC | PRN

## 2023-05-22 MED ORDER — WATER FOR IRRIGATION, STERILE IR SOLN
Status: DC | PRN
Start: 2023-05-22 — End: 2023-05-22
  Administered 2023-05-22: 1000 mL

## 2023-05-22 MED ORDER — OXYCODONE HCL 5 MG PO TABS: 5.0000 mg | ORAL_TABLET | Freq: Once | ORAL | Status: DC | PRN

## 2023-05-22 MED ORDER — ALBUTEROL SULFATE HFA 108 (90 BASE) MCG/ACT IN AERS: 1.0000 | INHALATION_SPRAY | Freq: Four times a day (QID) | RESPIRATORY_TRACT | Status: DC | PRN

## 2023-05-22 MED ORDER — ACETAMINOPHEN 10 MG/ML IV SOLN: 1000.0000 mg | Freq: Once | INTRAVENOUS | Status: DC | PRN

## 2023-05-22 MED ORDER — LACTATED RINGERS IV SOLN: INTRAVENOUS | Status: DC

## 2023-05-22 MED ORDER — APIXABAN 2.5 MG PO TABS
2.5000 mg | ORAL_TABLET | Freq: Two times a day (BID) | ORAL | Status: DC
  Administered 2023-05-23: 2.5 mg via ORAL
  Filled 2023-05-22: qty 1

## 2023-05-22 MED ORDER — HYDROMORPHONE HCL 1 MG/ML IJ SOLN: 0.5000 mg | INTRAMUSCULAR | Status: DC | PRN

## 2023-05-22 MED ORDER — PHENYLEPHRINE 80 MCG/ML (10ML) SYRINGE FOR IV PUSH (FOR BLOOD PRESSURE SUPPORT)
PREFILLED_SYRINGE | INTRAVENOUS | Status: DC | PRN
Start: 2023-05-22 — End: 2023-05-22
  Administered 2023-05-22 (×3): 80 ug via INTRAVENOUS

## 2023-05-22 MED ORDER — CEFAZOLIN SODIUM-DEXTROSE 2-4 GM/100ML-% IV SOLN
2.0000 g | INTRAVENOUS | Status: AC
  Administered 2023-05-22: 2 g via INTRAVENOUS
  Filled 2023-05-22: qty 100

## 2023-05-22 MED ORDER — ONDANSETRON HCL 4 MG/2ML IJ SOLN
INTRAMUSCULAR | Status: AC
  Filled 2023-05-22: qty 2

## 2023-05-22 MED ORDER — DOCUSATE SODIUM 100 MG PO CAPS
100.0000 mg | ORAL_CAPSULE | Freq: Two times a day (BID) | ORAL | Status: DC
  Administered 2023-05-23: 100 mg via ORAL
  Filled 2023-05-22: qty 1

## 2023-05-22 MED ORDER — BUPIVACAINE-EPINEPHRINE (PF) 0.25% -1:200000 IJ SOLN
INTRAMUSCULAR | Status: AC
  Filled 2023-05-22: qty 30

## 2023-05-22 MED ORDER — DEXAMETHASONE SODIUM PHOSPHATE 10 MG/ML IJ SOLN
10.0000 mg | Freq: Once | INTRAMUSCULAR | Status: AC
  Administered 2023-05-23: 10 mg via INTRAVENOUS
  Filled 2023-05-22: qty 1

## 2023-05-22 MED ORDER — MAGNESIUM CITRATE PO SOLN: 1.0000 | Freq: Once | ORAL | Status: DC | PRN

## 2023-05-22 MED ORDER — ALBUTEROL SULFATE (2.5 MG/3ML) 0.083% IN NEBU: 2.5000 mg | INHALATION_SOLUTION | Freq: Four times a day (QID) | RESPIRATORY_TRACT | Status: DC | PRN

## 2023-05-22 MED ORDER — POVIDONE-IODINE 10 % EX SWAB: 2.0000 | Freq: Once | CUTANEOUS | Status: DC

## 2023-05-22 MED ORDER — METOCLOPRAMIDE HCL 5 MG/ML IJ SOLN: 5.0000 mg | Freq: Three times a day (TID) | INTRAMUSCULAR | Status: DC | PRN

## 2023-05-22 MED ORDER — POLYETHYLENE GLYCOL 3350 17 G PO PACK: 17.0000 g | PACK | Freq: Every day | ORAL | Status: DC | PRN

## 2023-05-22 MED ORDER — LIDOCAINE HCL (CARDIAC) PF 100 MG/5ML IV SOSY
PREFILLED_SYRINGE | INTRAVENOUS | Status: DC | PRN
  Administered 2023-05-22: 40 mg via INTRATRACHEAL

## 2023-05-22 MED ORDER — PROPOFOL 500 MG/50ML IV EMUL
INTRAVENOUS | Status: DC | PRN
  Administered 2023-05-22: 25 ug/kg/min via INTRAVENOUS
  Administered 2023-05-22: 30 mg via INTRAVENOUS
  Administered 2023-05-22: 20 mg via INTRAVENOUS

## 2023-05-22 MED ORDER — MENTHOL 3 MG MT LOZG: 1.0000 | LOZENGE | OROMUCOSAL | Status: DC | PRN

## 2023-05-22 MED ORDER — ACETAMINOPHEN 10 MG/ML IV SOLN
1000.0000 mg | Freq: Four times a day (QID) | INTRAVENOUS | Status: DC
Start: 2023-05-22 — End: 2023-05-22
  Administered 2023-05-22: 1000 mg via INTRAVENOUS
  Filled 2023-05-22: qty 100

## 2023-05-22 MED ORDER — CEFAZOLIN SODIUM-DEXTROSE 2-4 GM/100ML-% IV SOLN
2.0000 g | Freq: Four times a day (QID) | INTRAVENOUS | Status: AC
  Administered 2023-05-22 – 2023-05-23 (×2): 2 g via INTRAVENOUS
  Filled 2023-05-22 (×2): qty 100

## 2023-05-22 MED ORDER — DICYCLOMINE HCL 10 MG PO CAPS: 10.0000 mg | ORAL_CAPSULE | Freq: Two times a day (BID) | ORAL | Status: DC | PRN

## 2023-05-22 MED ORDER — TRANEXAMIC ACID 1000 MG/10ML IV SOLN
INTRAVENOUS | Status: DC | PRN
  Administered 2023-05-22: 2000 mg via TOPICAL

## 2023-05-22 MED ORDER — HYDROMORPHONE HCL 2 MG PO TABS: 1.0000 mg | ORAL_TABLET | ORAL | Status: DC | PRN

## 2023-05-22 MED ORDER — MIDAZOLAM HCL 2 MG/2ML IJ SOLN
INTRAMUSCULAR | Status: AC
  Filled 2023-05-22: qty 2

## 2023-05-22 MED ORDER — FENTANYL CITRATE (PF) 100 MCG/2ML IJ SOLN
INTRAMUSCULAR | Status: AC
  Filled 2023-05-22: qty 2

## 2023-05-22 MED ORDER — BUPIVACAINE IN DEXTROSE 0.75-8.25 % IT SOLN
INTRATHECAL | Status: DC | PRN
  Administered 2023-05-22: 1.6 mL via INTRATHECAL

## 2023-05-22 MED ORDER — TRANEXAMIC ACID 1000 MG/10ML IV SOLN
2000.0000 mg | Freq: Once | INTRAVENOUS | Status: DC
  Filled 2023-05-22: qty 20

## 2023-05-22 MED ORDER — ORAL CARE MOUTH RINSE: 15.0000 mL | Freq: Once | OROMUCOSAL | Status: AC

## 2023-05-22 MED ORDER — ACETAMINOPHEN 325 MG PO TABS
325.0000 mg | ORAL_TABLET | Freq: Four times a day (QID) | ORAL | Status: DC | PRN
  Administered 2023-05-23 (×2): 650 mg via ORAL
  Filled 2023-05-22 (×2): qty 2

## 2023-05-22 MED ORDER — ONDANSETRON HCL 4 MG PO TABS: 4.0000 mg | ORAL_TABLET | Freq: Four times a day (QID) | ORAL | Status: DC | PRN

## 2023-05-22 MED ORDER — OXYCODONE HCL 5 MG/5ML PO SOLN: 5.0000 mg | Freq: Once | ORAL | Status: DC | PRN

## 2023-05-22 MED ORDER — 0.9 % SODIUM CHLORIDE (POUR BTL) OPTIME
TOPICAL | Status: DC | PRN
  Administered 2023-05-22: 1000 mL

## 2023-05-22 MED ORDER — PHENOL 1.4 % MT LIQD: 1.0000 | OROMUCOSAL | Status: DC | PRN

## 2023-05-22 MED ORDER — ONDANSETRON HCL 4 MG/2ML IJ SOLN: 4.0000 mg | Freq: Four times a day (QID) | INTRAMUSCULAR | Status: DC | PRN

## 2023-05-22 MED ORDER — DEXAMETHASONE SODIUM PHOSPHATE 10 MG/ML IJ SOLN
INTRAMUSCULAR | Status: DC | PRN
  Administered 2023-05-22: 10 mg via INTRAVENOUS

## 2023-05-22 MED ORDER — SODIUM CHLORIDE 0.9 % IV SOLN: INTRAVENOUS | Status: DC

## 2023-05-22 MED ORDER — CHLORHEXIDINE GLUCONATE 0.12 % MT SOLN
15.0000 mL | Freq: Once | OROMUCOSAL | Status: AC
  Administered 2023-05-22: 15 mL via OROMUCOSAL

## 2023-05-22 MED ORDER — METOCLOPRAMIDE HCL 5 MG PO TABS: 5.0000 mg | ORAL_TABLET | Freq: Three times a day (TID) | ORAL | Status: DC | PRN

## 2023-05-22 MED ORDER — MIDAZOLAM HCL 5 MG/5ML IJ SOLN
INTRAMUSCULAR | Status: DC | PRN
  Administered 2023-05-22: 1 mg via INTRAVENOUS

## 2023-05-22 MED ORDER — HYDROMORPHONE HCL 2 MG PO TABS
2.0000 mg | ORAL_TABLET | ORAL | Status: DC | PRN
  Administered 2023-05-23 (×2): 2 mg via ORAL
  Filled 2023-05-22 (×2): qty 1

## 2023-05-22 MED ORDER — GABAPENTIN 100 MG PO CAPS
100.0000 mg | ORAL_CAPSULE | Freq: Three times a day (TID) | ORAL | Status: DC
  Administered 2023-05-22 – 2023-05-23 (×3): 100 mg via ORAL
  Filled 2023-05-22 (×3): qty 1

## 2023-05-22 MED ORDER — BUPIVACAINE-EPINEPHRINE (PF) 0.25% -1:200000 IJ SOLN
INTRAMUSCULAR | Status: DC | PRN
  Administered 2023-05-22: 30 mL

## 2023-05-22 MED ORDER — DEXAMETHASONE SODIUM PHOSPHATE 10 MG/ML IJ SOLN
INTRAMUSCULAR | Status: AC
  Filled 2023-05-22: qty 1

## 2023-05-22 MED ORDER — FENTANYL CITRATE (PF) 100 MCG/2ML IJ SOLN
INTRAMUSCULAR | Status: DC | PRN
  Administered 2023-05-22 (×2): 25 ug via INTRAVENOUS

## 2023-05-22 MED ORDER — ONDANSETRON HCL 4 MG/2ML IJ SOLN
INTRAMUSCULAR | Status: DC | PRN
  Administered 2023-05-22: 4 mg via INTRAVENOUS

## 2023-05-22 MED ORDER — BISACODYL 10 MG RE SUPP: 10.0000 mg | Freq: Every day | RECTAL | Status: DC | PRN

## 2023-05-22 SURGICAL SUPPLY — 35 items
BAG COUNTER SPONGE SURGICOUNT (BAG) IMPLANT
BAG ZIPLOCK 12X15 (MISCELLANEOUS) IMPLANT
BLADE SAG 18X100X1.27 (BLADE) ×1 IMPLANT
COVER PERINEAL POST (MISCELLANEOUS) ×1 IMPLANT
COVER SURGICAL LIGHT HANDLE (MISCELLANEOUS) ×1 IMPLANT
CUP ACETBLR 48 OD SECTOR II (Hips) IMPLANT
DERMABOND ADVANCED .7 DNX12 (GAUZE/BANDAGES/DRESSINGS) ×1 IMPLANT
DRAPE FOOT SWITCH (DRAPES) ×1 IMPLANT
DRAPE STERI IOBAN 125X83 (DRAPES) ×1 IMPLANT
DRAPE U-SHAPE 47X51 STRL (DRAPES) ×2 IMPLANT
DRSG AQUACEL AG ADV 3.5X10 (GAUZE/BANDAGES/DRESSINGS) ×1 IMPLANT
DURAPREP 26ML APPLICATOR (WOUND CARE) ×1 IMPLANT
ELECT REM PT RETURN 15FT ADLT (MISCELLANEOUS) ×1 IMPLANT
GLOVE BIO SURGEON STRL SZ 6.5 (GLOVE) IMPLANT
GLOVE BIO SURGEON STRL SZ7 (GLOVE) IMPLANT
GLOVE BIO SURGEON STRL SZ8 (GLOVE) ×1 IMPLANT
GLOVE BIOGEL PI IND STRL 7.0 (GLOVE) IMPLANT
GLOVE BIOGEL PI IND STRL 8 (GLOVE) ×1 IMPLANT
GOWN STRL REUS W/ TWL LRG LVL3 (GOWN DISPOSABLE) ×2 IMPLANT
HEAD FEM STD 28X+1.5 STRL (Hips) IMPLANT
HOLDER FOLEY CATH W/STRAP (MISCELLANEOUS) ×1 IMPLANT
KIT TURNOVER KIT A (KITS) IMPLANT
LINER MARATHON 28 48 (Hips) IMPLANT
MANIFOLD NEPTUNE II (INSTRUMENTS) ×1 IMPLANT
PACK ANTERIOR HIP CUSTOM (KITS) ×1 IMPLANT
PENCIL SMOKE EVACUATOR COATED (MISCELLANEOUS) ×1 IMPLANT
SPIKE FLUID TRANSFER (MISCELLANEOUS) ×1 IMPLANT
STEM FEMORAL SZ 6MM STD ACTIS (Stem) IMPLANT
SUT ETHIBOND NAB CT1 #1 30IN (SUTURE) ×1 IMPLANT
SUT MNCRL AB 4-0 PS2 18 (SUTURE) ×1 IMPLANT
SUT VIC AB 2-0 CT1 TAPERPNT 27 (SUTURE) ×2 IMPLANT
SUTURE STRATFX 0 PDS 27 VIOLET (SUTURE) ×1 IMPLANT
TOWEL GREEN STERILE FF (TOWEL DISPOSABLE) ×1 IMPLANT
TRAY FOLEY MTR SLVR 16FR STAT (SET/KITS/TRAYS/PACK) ×1 IMPLANT
TUBE SUCTION HIGH CAP CLEAR NV (SUCTIONS) ×1 IMPLANT

## 2023-05-22 NOTE — Interval H&P Note (Signed)
 History and Physical Interval Note:  05/22/2023 10:48 AM  Penny Lamb  has presented today for surgery, with the diagnosis of Right Hip Osteoarthritis.  The various methods of treatment have been discussed with the patient and family. After consideration of risks, benefits and other options for treatment, the patient has consented to  Procedure(s): ARTHROPLASTY, HIP, TOTAL, ANTERIOR APPROACH (Right) as a surgical intervention.  The patient's history has been reviewed, patient examined, no change in status, stable for surgery.  I have reviewed the patient's chart and labs.  Questions were answered to the patient's satisfaction.     Samuel Crock Janathan Bribiesca

## 2023-05-22 NOTE — Plan of Care (Signed)

## 2023-05-22 NOTE — Op Note (Signed)
 OPERATIVE REPORT- TOTAL HIP ARTHROPLASTY   PREOPERATIVE DIAGNOSIS: Osteoarthritis of the Right hip.   POSTOPERATIVE DIAGNOSIS: Osteoarthritis of the Right  hip.   PROCEDURE: Right total hip arthroplasty, anterior approach.   SURGEON: Liliane Rei, MD   ASSISTANT: Brinton Canavan, PA-C  ANESTHESIA:  Spinal  ESTIMATED BLOOD LOSS:-250 mL    DRAINS: None  COMPLICATIONS: None   CONDITION: PACU - hemodynamically stable.   BRIEF CLINICAL NOTE: Penny Lamb is a 78 y.o. female who has advanced end-  stage arthritis of their Right  hip with progressively worsening pain and  dysfunction.The patient has failed nonoperative management and presents for  total hip arthroplasty.   PROCEDURE IN DETAIL: After successful administration of spinal  anesthetic, the traction boots for the [facility] bed were placed on both  feet and the patient was placed onto the [facility] bed, boots placed into the leg  holders. The Right hip was then isolated from the perineum with plastic  drapes and prepped and draped in the usual sterile fashion. ASIS and  greater trochanter were marked and a oblique incision was made, starting  at about 1 cm lateral and 2 cm distal to the ASIS and coursing towards  the anterior cortex of the femur. The skin was cut with a 10 blade  through subcutaneous tissue to the level of the fascia overlying the  tensor fascia lata muscle. The fascia was then incised in line with the  incision at the junction of the anterior third and posterior 2/3rd. The  muscle was teased off the fascia and then the interval between the TFL  and the rectus was developed. The Hohmann retractor was then placed at  the top of the femoral neck over the capsule. The vessels overlying the  capsule were cauterized and the fat on top of the capsule was removed.  A Hohmann retractor was then placed anterior underneath the rectus  femoris to give exposure to the entire anterior capsule. A T-shaped   capsulotomy was performed. The edges were tagged and the femoral head  was identified.       Osteophytes are removed off the superior acetabulum.  The femoral neck was then cut in situ with an oscillating saw. Traction  was then applied to the left lower extremity utilizing the [employer]  traction. The femoral head was then removed. Retractors were placed  around the acetabulum and then circumferential removal of the labrum was  performed. Osteophytes were also removed. Reaming starts at 45 mm to  medialize and  Increased in 2 mm increments to 47 mm. We reamed in  approximately 40 degrees of abduction, 20 degrees anteversion. A 48 mm  pinnacle acetabular shell was then impacted in anatomic position under  fluoroscopic guidance with excellent purchase. We did not need to place  any additional dome screws. A 28 mm Neutral + 4 marathon liner was then  placed into the acetabular shell.       The femoral lift was then placed along the lateral aspect of the femur  just distal to the vastus ridge. The leg was  externally rotated and capsule  was stripped off the inferior aspect of the femoral neck down to the  level of the lesser trochanter, this was done with electrocautery. The femur was lifted after this was performed. The  leg was then placed in an extended and adducted position essentially delivering the femur. We also removed the capsule superiorly and the piriformis from the piriformis fossa to gain  excellent exposure of the  proximal femur. Rongeur was used to remove some cancellous bone to get  into the lateral portion of the proximal femur for placement of the  initial starter reamer. The starter broaches was placed  the starter broach  and was shown to go down the center of the canal. Broaching  with the Actis system was then performed starting at size 0  coursing  Up to size 6. A size 6 had excellent torsional and rotational  and axial stability. The trial standard offset neck was then  placed  with a 28 + 1.5 trial head. The hip was then reduced. We confirmed that  the stem was in the canal both on AP and lateral x-rays. It also has excellent sizing. The hip was reduced with outstanding stability through full extension and full external rotation.. AP pelvis was taken and the leg lengths were measured and found to be equal. Hip was then dislocated again and the femoral head and neck removed. The  femoral broach was removed. Size 6 Actis stem with a standard offset  neck was then impacted into the femur following native anteversion. Has  excellent purchase in the canal. Excellent torsional and rotational and  axial stability. It is confirmed to be in the canal on AP and lateral  fluoroscopic views. The 28 + 1.5 metal head was placed and the hip  reduced with outstanding stability. Again AP pelvis was taken and it  confirmed that the leg lengths were equal. The wound was then copiously  irrigated with saline solution and the capsule reattached and repaired  with Ethibond suture. 30 ml of .25% Bupivicaine was  injected into the capsule and into the edge of the tensor fascia lata as well as subcutaneous tissue. The fascia overlying the tensor fascia lata was then closed with a running #1 V-Loc. Subcu was closed with interrupted 2-0 Vicryl and subcuticular running 4-0 Monocryl. Incision was cleaned  and dried. Steri-Strips and a bulky sterile dressing applied. The patient was awakened and transported to  recovery in stable condition.        Please note that a surgical assistant was a medical necessity for this procedure to perform it in a safe and expeditious manner. Assistant was necessary to provide appropriate retraction of vital neurovascular structures and to prevent femoral fracture and allow for anatomic placement of the prosthesis.  Liliane Rei, M.D.

## 2023-05-22 NOTE — Anesthesia Procedure Notes (Signed)
 Spinal  Patient location during procedure: OR Start time: 05/22/2023 1:16 PM End time: 05/22/2023 1:19 PM Reason for block: surgical anesthesia Staffing Performed: anesthesiologist  Anesthesiologist: Tura Gaines, MD Performed by: Tura Gaines, MD Authorized by: Tura Gaines, MD   Preanesthetic Checklist Completed: patient identified, IV checked, site marked, risks and benefits discussed, surgical consent, monitors and equipment checked, pre-op evaluation and timeout performed Spinal Block Patient position: sitting Prep: DuraPrep and site prepped and draped Patient monitoring: heart rate, cardiac monitor, continuous pulse ox and blood pressure Approach: midline Location: L3-4 Injection technique: single-shot Needle Needle type: Pencan  Needle gauge: 24 G Needle length: 9 cm Needle insertion depth: 6 cm Assessment Sensory level: T4 Events: CSF return Additional Notes Patient tolerated procedure well. Adequate sensory level.

## 2023-05-22 NOTE — Anesthesia Preprocedure Evaluation (Addendum)
 Anesthesia Evaluation  Patient identified by MRN, date of birth, ID band Patient awake    Reviewed: Allergy & Precautions, NPO status , Patient's Chart, lab work & pertinent test results  History of Anesthesia Complications Negative for: history of anesthetic complications  Airway Mallampati: II  TM Distance: >3 FB     Dental no notable dental hx. (+) Teeth Intact, Dental Advisory Given   Pulmonary asthma , pneumonia, COPD, former smoker   Pulmonary exam normal breath sounds clear to auscultation       Cardiovascular negative cardio ROS Normal cardiovascular exam Rhythm:Regular Rate:Normal  1. Left ventricular ejection fraction by 3D volume is 72 %. The left  ventricle has normal function. The left ventricle has no regional wall  motion abnormalities. There is mild concentric left ventricular  hypertrophy. Left ventricular diastolic  parameters are indeterminate. The average left ventricular global  longitudinal strain is -21.7 %. The global longitudinal strain is normal.   2. Right ventricular systolic function is normal. The right ventricular  size is normal.   3. The mitral valve is normal in structure. Trivial mitral valve  regurgitation. No evidence of mitral stenosis.   4. The aortic valve is normal in structure. Aortic valve regurgitation is  not visualized. No aortic stenosis is present.   5. The inferior vena cava is normal in size with greater than 50%  respiratory variability, suggesting right atrial pressure of 3 mmHg.     Neuro/Psych  Headaches PSYCHIATRIC DISORDERS  Depression       GI/Hepatic Neg liver ROS,,,? IBS vs UC vs Crohn's disease   Endo/Other  Hyperlipidemia  Renal/GU Renal InsufficiencyRenal diseaseHx/o nephrolithiasis  negative genitourinary   Musculoskeletal  (+) Arthritis , Osteoarthritis,  DDD lumbar spine OA right hip   Abdominal   Peds  Hematology Lab Results      Component                 Value               Date                      WBC                      6.6                 05/13/2023                HGB                      12.4                05/13/2023                HCT                      39.7                05/13/2023                MCV                      97.5                05/13/2023                PLT  280                 05/13/2023              Anesthesia Other Findings   Reproductive/Obstetrics                              Anesthesia Physical Anesthesia Plan  ASA: 2  Anesthesia Plan: MAC and Spinal   Post-op Pain Management:    Induction: Intravenous  PONV Risk Score and Plan: 2 and Propofol infusion and Treatment may vary due to age or medical condition  Airway Management Planned: Nasal Cannula, Simple Face Mask and Natural Airway  Additional Equipment: None  Intra-op Plan:   Post-operative Plan:   Informed Consent: I have reviewed the patients History and Physical, chart, labs and discussed the procedure including the risks, benefits and alternatives for the proposed anesthesia with the patient or authorized representative who has indicated his/her understanding and acceptance.     Dental advisory given  Plan Discussed with: CRNA and Anesthesiologist  Anesthesia Plan Comments:         Anesthesia Quick Evaluation

## 2023-05-22 NOTE — Discharge Instructions (Addendum)
 Penny Rei, MD Total Joint Specialist [facility] [address] [place], [place] [address] [phone]  ANTERIOR APPROACH TOTAL HIP REPLACEMENT POSTOPERATIVE DIRECTIONS     Hip Rehabilitation, Guidelines Following Surgery  The results of a hip operation are greatly improved after range of motion and muscle strengthening exercises. Follow all safety measures which are given to protect your hip. If any of these exercises cause increased pain or swelling in your joint, decrease the amount until you are comfortable again. Then slowly increase the exercises. Call your caregiver if you have problems or questions.   BLOOD CLOT PREVENTION Take a 2.5 mg Eliquis twice a day for three weeks following surgery. Then take an 81 mg Aspirin once a day for three weeks. Then discontinue Aspirin. You may resume your vitamins/supplements once you have discontinued the Eliquis Do not take any NSAIDs (Advil, Aleve, Ibuprofen, Meloxicam, etc.) until you have discontinued the Eliquis   HOME CARE INSTRUCTIONS  Remove items at home which could result in a fall. This includes throw rugs or furniture in walking pathways.  ICE to the affected hip as frequently as 20-30 minutes an hour and then as needed for pain and swelling. Continue to use ice on the hip for pain and swelling from surgery. You may notice swelling that will progress down to the foot and ankle. This is normal after surgery. Elevate the leg when you are not up walking on it.   Continue to use the breathing machine which will help keep your temperature down.  It is common for your temperature to cycle up and down following surgery, especially at night when you are not up moving around and exerting yourself.  The breathing machine keeps your lungs expanded and your temperature down.  DIET You may resume your previous home diet once your are discharged from the hospital.  DRESSING / WOUND CARE / SHOWERING You have an  adhesive waterproof bandage over the incision. Leave this in place until your first follow-up appointment. Once you remove this you will not need to place another bandage.  You may begin showering 3 days following surgery, but do not submerge the incision under water.  ACTIVITY For the first 3-5 days, it is important to rest and keep the operative leg elevated. You should, as a general rule, rest for 50 minutes and walk/stretch for 10 minutes per hour. After 5 days, you may slowly increase activity as tolerated.  Perform the exercises you were provided twice a day for about 15-20 minutes each session. Begin these 2 days following surgery. Walk with your walker as instructed. Use the walker until you are comfortable transitioning to a cane. Walk with the cane in the opposite hand of the operative leg. You may discontinue the cane once you are comfortable and walking steadily. Avoid periods of inactivity such as sitting longer than an hour when not asleep. This helps prevent blood clots.  Do not drive a car for 6 weeks or until released by your surgeon.  Do not drive while taking narcotics.  TED HOSE STOCKINGS Wear the elastic stockings on both legs for three weeks following surgery during the day. You may remove them at night while sleeping.  WEIGHT BEARING Weight bearing as tolerated with assist device (walker, cane, etc) as directed, use it as long as suggested by your surgeon or therapist, typically at least 4-6 weeks.  POSTOPERATIVE CONSTIPATION PROTOCOL Constipation - defined medically as fewer than three stools per week and severe constipation as less  than one stool per week.  One of the most common issues patients have following surgery is constipation.  Even if you have a regular bowel pattern at home, your normal regimen is likely to be disrupted due to multiple reasons following surgery.  Combination of anesthesia, postoperative narcotics, change in appetite and fluid intake all can  affect your bowels.  In order to avoid complications following surgery, here are some recommendations in order to help you during your recovery period.  Colace (docusate) - Pick up an over-the-counter form of Colace or another stool softener and take twice a day as long as you are requiring postoperative pain medications.  Take with a full glass of water daily.  If you experience loose stools or diarrhea, hold the colace until you stool forms back up.  If your symptoms do not get better within 1 week or if they get worse, check with your doctor. Dulcolax (bisacodyl) - Pick up over-the-counter and take as directed by the product packaging as needed to assist with the movement of your bowels.  Take with a full glass of water.  Use this product as needed if not relieved by Colace only.  MiraLax (polyethylene glycol) - Pick up over-the-counter to have on hand.  MiraLax is a solution that will increase the amount of water in your bowels to assist with bowel movements.  Take as directed and can mix with a glass of water, juice, soda, coffee, or tea.  Take if you go more than two days without a movement.Do not use MiraLax more than once per day. Call your doctor if you are still constipated or irregular after using this medication for 7 days in a row.  If you continue to have problems with postoperative constipation, please contact the office for further assistance and recommendations.  If you experience "the worst abdominal pain ever" or develop nausea or vomiting, please contact the office immediatly for further recommendations for treatment.  ITCHING  If you experience itching with your medications, try taking only a single pain pill, or even half a pain pill at a time.  You can also use Benadryl over the counter for itching or also to help with sleep.   MEDICATIONS See your medication summary on the "After Visit Summary" that the nursing staff will review with you prior to discharge.  You may have some home  medications which will be placed on hold until you complete the course of blood thinner medication.  It is important for you to complete the blood thinner medication as prescribed by your surgeon.  Continue your approved medications as instructed at time of discharge.  PRECAUTIONS If you experience chest pain or shortness of breath - call 911 immediately for transfer to the hospital emergency department.  If you develop a fever greater that 101 F, purulent drainage from wound, increased redness or drainage from wound, foul odor from the wound/dressing, or calf pain - CONTACT YOUR SURGEON.                                                   FOLLOW-UP APPOINTMENTS Make sure you keep all of your appointments after your operation with your surgeon and caregivers. You should call the office at the above phone number and make an appointment for approximately two weeks after the date of your surgery or on the date  instructed by your surgeon outlined in the "After Visit Summary".  RANGE OF MOTION AND STRENGTHENING EXERCISES  These exercises are designed to help you keep full movement of your hip joint. Follow your caregiver's or physical therapist's instructions. Perform all exercises about fifteen times, three times per day or as directed. Exercise both hips, even if you have had only one joint replacement. These exercises can be done on a training (exercise) mat, on the floor, on a table or on a bed. Use whatever works the best and is most comfortable for you. Use music or television while you are exercising so that the exercises are a pleasant break in your day. This will make your life better with the exercises acting as a break in routine you can look forward to.  Lying on your back, slowly slide your foot toward your buttocks, raising your knee up off the floor. Then slowly slide your foot back down until your leg is straight again.  Lying on your back spread your legs as far apart as you can without causing  discomfort.  Lying on your side, raise your upper leg and foot straight up from the floor as far as is comfortable. Slowly lower the leg and repeat.  Lying on your back, tighten up the muscle in the front of your thigh (quadriceps muscles). You can do this by keeping your leg straight and trying to raise your heel off the floor. This helps strengthen the largest muscle supporting your knee.  Lying on your back, tighten up the muscles of your buttocks both with the legs straight and with the knee bent at a comfortable angle while keeping your heel on the floor.   POST-OPERATIVE OPIOID TAPER INSTRUCTIONS: It is important to wean off of your opioid medication as soon as possible. If you do not need pain medication after your surgery it is ok to stop day one. Opioids include: Codeine, Hydrocodone (Norco, Vicodin), Oxycodone (Percocet, oxycontin ) and hydromorphone amongst others.  Long term and even short term use of opiods can cause: Increased pain response Dependence Constipation Depression Respiratory depression And more.  Withdrawal symptoms can include Flu like symptoms Nausea, vomiting And more Techniques to manage these symptoms Hydrate well Eat regular healthy meals Stay active Use relaxation techniques(deep breathing, meditating, yoga) Do Not substitute Alcohol to help with tapering If you have been on opioids for less than two weeks and do not have pain than it is ok to stop all together.  Plan to wean off of opioids This plan should start within one week post op of your joint replacement. Maintain the same interval or time between taking each dose and first decrease the dose.  Cut the total daily intake of opioids by one tablet each day Next start to increase the time between doses. The last dose that should be eliminated is the evening dose.   IF YOU ARE TRANSFERRED TO A SKILLED REHAB FACILITY If the patient is transferred to a skilled rehab facility following release from the  hospital, a list of the current medications will be sent to the facility for the patient to continue.  When discharged from the skilled rehab facility, please have the facility set up the patient's Home Health Physical Therapy prior to being released. Also, the skilled facility will be responsible for providing the patient with their medications at time of release from the facility to include their pain medication, the muscle relaxants, and their blood thinner medication. If the patient is still at the rehab facility at  time of the two week follow up appointment, the skilled rehab facility will also need to assist the patient in arranging follow up appointment in our office and any transportation needs.  MAKE SURE YOU:  Understand these instructions.  Get help right away if you are not doing well or get worse.    DENTAL ANTIBIOTICS:  In most cases prophylactic antibiotics for Dental procdeures after total joint surgery are not necessary.  Exceptions are as follows:  1. History of prior total joint infection  2. Severely immunocompromised (Organ Transplant, cancer chemotherapy, Rheumatoid biologic meds such as Humera)  3. Poorly controlled diabetes (A1C &gt; 8.0, blood glucose over 200)  If you have one of these conditions, contact your surgeon for an antibiotic prescription, prior to your dental procedure.    Pick up stool softner and laxative for home use following surgery while on pain medications. Do not submerge incision under water. Please use good hand washing techniques while changing dressing each day. May shower starting three days after surgery. Please use a clean towel to pat the incision dry following showers. Continue to use ice for pain and swelling after surgery. Do not use any lotions or creams on the incision until instructed by your surgeon. ______________________________________________  Information on my medicine - ELIQUIS (apixaban)  This medication education  was reviewed with me or my healthcare representative as part of my discharge preparation.    Why was Eliquis prescribed for you? Eliquis was prescribed for you to reduce the risk of blood clots forming after orthopedic surgery.    What do You need to know about Eliquis? Take your Eliquis TWICE DAILY - one tablet in the morning and one tablet in the evening with or without food.  It would be best to take the dose about the same time each day.  If you have difficulty swallowing the tablet whole please discuss with your pharmacist how to take the medication safely.  Take Eliquis exactly as prescribed by your doctor and DO NOT stop taking Eliquis without talking to the doctor who prescribed the medication.  Stopping without other medication to take the place of Eliquis may increase your risk of developing a clot.  After discharge, you should have regular check-up appointments with your healthcare provider that is prescribing your Eliquis.  What do you do if you miss a dose? If a dose of ELIQUIS is not taken at the scheduled time, take it as soon as possible on the same day and twice-daily administration should be resumed.  The dose should not be doubled to make up for a missed dose.  Do not take more than one tablet of ELIQUIS at the same time.  Important Safety Information A possible side effect of Eliquis is bleeding. You should call your healthcare provider right away if you experience any of the following: Bleeding from an injury or your nose that does not stop. Unusual colored urine (red or dark brown) or unusual colored stools (red or black). Unusual bruising for unknown reasons. A serious fall or if you hit your head (even if there is no bleeding).  Some medicines may interact with Eliquis and might increase your risk of bleeding or clotting while on Eliquis. To help avoid this, consult your healthcare provider or pharmacist prior to using any new prescription or  non-prescription medications, including herbals, vitamins, non-steroidal anti-inflammatory drugs (NSAIDs) and supplements.  This website has more information on Eliquis (apixaban): http://www.eliquis.com/eliquis/home

## 2023-05-22 NOTE — Transfer of Care (Signed)
 Immediate Anesthesia Transfer of Care Note  Patient: Penny Lamb  Procedure(s) Performed: ARTHROPLASTY, HIP, TOTAL, ANTERIOR APPROACH (Right: Hip)  Patient Location: PACU  Anesthesia Type:Spinal and MAC combined with regional for post-op pain  Level of Consciousness: awake, alert , oriented, and patient cooperative  Airway & Oxygen Therapy: Patient Spontanous Breathing and Patient connected to face mask oxygen  Post-op Assessment: Report given to RN and Post -op Vital signs reviewed and stable  Post vital signs: Reviewed and stable  Last Vitals:  Vitals Value Taken Time  BP 108/59 05/22/23 1500  Temp    Pulse 65 05/22/23 1501  Resp 16 05/22/23 1501  SpO2 97 % 05/22/23 1501  Vitals shown include unfiled device data.  Last Pain:  Vitals:   05/22/23 1011  TempSrc:   PainSc: 0-No pain         Complications: No notable events documented.

## 2023-05-23 ENCOUNTER — Other Ambulatory Visit (HOSPITAL_BASED_OUTPATIENT_CLINIC_OR_DEPARTMENT_OTHER): Payer: Self-pay

## 2023-05-23 ENCOUNTER — Other Ambulatory Visit (HOSPITAL_COMMUNITY): Payer: Self-pay

## 2023-05-23 ENCOUNTER — Encounter (HOSPITAL_COMMUNITY): Payer: Self-pay | Admitting: Orthopedic Surgery

## 2023-05-23 DIAGNOSIS — Z79899 Other long term (current) drug therapy: Secondary | ICD-10-CM | POA: Diagnosis not present

## 2023-05-23 DIAGNOSIS — Z85828 Personal history of other malignant neoplasm of skin: Secondary | ICD-10-CM | POA: Diagnosis not present

## 2023-05-23 DIAGNOSIS — J449 Chronic obstructive pulmonary disease, unspecified: Secondary | ICD-10-CM | POA: Diagnosis not present

## 2023-05-23 DIAGNOSIS — Z87891 Personal history of nicotine dependence: Secondary | ICD-10-CM | POA: Diagnosis not present

## 2023-05-23 DIAGNOSIS — M1611 Unilateral primary osteoarthritis, right hip: Secondary | ICD-10-CM | POA: Diagnosis not present

## 2023-05-23 LAB — BASIC METABOLIC PANEL WITH GFR
Anion gap: 10 (ref 5–15)
BUN: 20 mg/dL (ref 8–23)
CO2: 24 mmol/L (ref 22–32)
Calcium: 8.5 mg/dL — ABNORMAL LOW (ref 8.9–10.3)
Chloride: 102 mmol/L (ref 98–111)
Creatinine, Ser: 0.48 mg/dL (ref 0.44–1.00)
GFR, Estimated: >60 mL/min (ref >60)
Glucose, Bld: 149 mg/dL — ABNORMAL HIGH (ref 70–99)
Potassium: 3.8 mmol/L (ref 3.5–5.1)
Sodium: 136 mmol/L (ref 135–145)

## 2023-05-23 LAB — CBC
HCT: 37.4 % (ref 36.0–46.0)
Hemoglobin: 12.1 g/dL (ref 12.0–15.0)
MCH: 31 pg (ref 26.0–34.0)
MCHC: 32.4 g/dL (ref 30.0–36.0)
MCV: 95.9 fL (ref 80.0–100.0)
Platelets: 259 10*3/uL (ref 150–400)
RBC: 3.9 MIL/uL (ref 3.87–5.11)
RDW: 13.8 % (ref 11.5–15.5)
WBC: 13.3 10*3/uL — ABNORMAL HIGH (ref 4.0–10.5)
nRBC: 0 % (ref 0.0–0.2)

## 2023-05-23 LAB — GLUCOSE, CAPILLARY: Glucose-Capillary: 134 mg/dL — ABNORMAL HIGH (ref 70–99)

## 2023-05-23 MED ORDER — HYDROMORPHONE HCL 2 MG PO TABS: 1.0000 mg | ORAL_TABLET | ORAL | Status: DC | PRN

## 2023-05-23 MED ORDER — ONDANSETRON HCL 4 MG PO TABS
4.0000 mg | ORAL_TABLET | Freq: Four times a day (QID) | ORAL | 0 refills | Status: AC | PRN
  Filled 2023-05-23: qty 20, 5d supply, fill #0

## 2023-05-23 MED ORDER — SODIUM CHLORIDE 0.9 % IV BOLUS
500.0000 mL | Freq: Once | INTRAVENOUS | Status: AC | PRN
  Administered 2023-05-23: 500 mL via INTRAVENOUS

## 2023-05-23 MED ORDER — GABAPENTIN 100 MG PO CAPS
ORAL_CAPSULE | ORAL | 0 refills | Status: AC
  Filled 2023-05-23: qty 42, 21d supply, fill #0

## 2023-05-23 MED ORDER — HYDROMORPHONE HCL 2 MG PO TABS
1.0000 mg | ORAL_TABLET | ORAL | 0 refills | Status: AC | PRN
Start: 2023-05-23 — End: ?
  Filled 2023-05-23: qty 30, 5d supply, fill #0

## 2023-05-23 MED ORDER — CYCLOBENZAPRINE HCL 10 MG PO TABS
10.0000 mg | ORAL_TABLET | Freq: Three times a day (TID) | ORAL | 0 refills | Status: AC | PRN
  Filled 2023-05-23: qty 40, 14d supply, fill #0

## 2023-05-23 MED ORDER — APIXABAN 2.5 MG PO TABS
2.5000 mg | ORAL_TABLET | Freq: Two times a day (BID) | ORAL | 0 refills | Status: AC
Start: 2023-05-24 — End: 2023-06-13
  Filled 2023-05-23: qty 40, 20d supply, fill #0

## 2023-05-23 NOTE — Progress Notes (Signed)
 Reviewed written d/c instructions w pt and her daughter, all questions answered, they both verbalized understanding. D/C via w/c w all belongings in stable condition.

## 2023-05-23 NOTE — Progress Notes (Signed)
 Called into rm to ck on pt (who was working w PT), pt pale, diaphoretic, stated she felt brief nausea and then sharp chest pain. VS checked, noted bp and pulse low. Assisted pt back to bed and rapid response notified. STAT EKG done, episode lasted approx 30 min. 500 cc bolus given and pt placed on telemetry (SB). Pt currently denies pain (except for surgical site pain), is w/d and in no distress. PA w Dr France Ina notified of above.

## 2023-05-23 NOTE — Progress Notes (Signed)
 Physical Therapy Treatment Patient Details Name: Penny Lamb MRN: [id number] DOB: 1945/03/10 Today's Date: 05/23/2023   History of Present Illness 78 y.o. female admitted 05/22/23 for R THA-AA. PMH: OA, asthma, COPD, depression.    PT Comments  Pt is progressing well with mobility, she ambulated 130' with RW, no loss of balance, no dizziness, HR in 90s with activity. Pt demonstrates good understanding of HEP. She is mobilizing well enough to DC from a PT standpoint.     If plan is discharge home, recommend the following: A little help with bathing/dressing/bathroom;Assistance with cooking/housework;Assist for transportation;Help with stairs or ramp for entrance   Can travel by private vehicle        Equipment Recommendations  Rolling walker (2 wheels)    Recommendations for Other Services       Precautions / Restrictions Precautions Precautions: Fall Recall of Precautions/Restrictions: Intact Precaution/Restrictions Comments: denies falls in past 6 months Restrictions Weight Bearing Restrictions Per Provider Order: No     Mobility  Bed Mobility Overal bed mobility: Modified Independent Bed Mobility: Supine to Sit     Supine to sit: HOB elevated, Used rails, Modified independent (Device/Increase time)     General bed mobility comments: assist to raise trunk, pivot hips, and advance RLE    Transfers Overall transfer level: Needs assistance Equipment used: Rolling walker (2 wheels) Transfers: Sit to/from Stand Sit to Stand: Supervision           General transfer comment: VCs hand placement    Ambulation/Gait Ambulation/Gait assistance: Contact guard assist Gait Distance (Feet): 130 Feet Assistive device: Rolling walker (2 wheels) Gait Pattern/deviations: Step-to pattern, Decreased step length - right, Decreased step length - left Gait velocity: decr     General Gait Details: steady, no loss of balance, no dizziness, HR 90s with  activity   Stairs             Wheelchair Mobility     Tilt Bed    Modified Rankin (Stroke Patients Only)       Balance Overall balance assessment: Modified Independent                                          Communication Communication Communication: No apparent difficulties  Cognition Arousal: Alert Behavior During Therapy: WFL for tasks assessed/performed   PT - Cognitive impairments: No apparent impairments                         Following commands: Intact      Cueing    Exercises Total Joint Exercises Ankle Circles/Pumps: AROM, Both, 10 reps, Supine Quad Sets: AROM, Both, 5 reps, Supine Short Arc Quad: AROM, Right, 10 reps, Supine Heel Slides: AAROM, Right, 10 reps, Supine Hip ABduction/ADduction: AAROM, Right, 5 reps, Supine Long Arc Quad: AROM, Right, 10 reps, Seated    General Comments        Pertinent Vitals/Pain Pain Assessment Pain Assessment: 0-10 Pain Score: 7  Pain Location: R hip with walking Pain Descriptors / Indicators: Sore Pain Intervention(s): Limited activity within patient's tolerance, Monitored during session, Ice applied    Home Living                          Prior Function            PT Goals (current goals can now  be found in the care plan section) Acute Rehab PT Goals Patient Stated Goal: flower gardening PT Goal Formulation: With patient Time For Goal Achievement: 06/06/23 Potential to Achieve Goals: Good Progress towards PT goals: Progressing toward goals    Frequency    7X/week      PT Plan      Co-evaluation              AM-PAC PT "6 Clicks" Mobility   Outcome Measure  Help needed turning from your back to your side while in a flat bed without using bedrails?: A Little Help needed moving from lying on your back to sitting on the side of a flat bed without using bedrails?: A Lot Help needed moving to and from a bed to a chair (including a  wheelchair)?: A Little Help needed standing up from a chair using your arms (e.g., wheelchair or bedside chair)?: A Little Help needed to walk in hospital room?: A Little Help needed climbing 3-5 steps with a railing? : A Lot 6 Click Score: 16    End of Session Equipment Utilized During Treatment: Gait belt Activity Tolerance: Patient tolerated treatment well Patient left: with call bell/phone within reach;in chair Nurse Communication: Mobility status PT Visit Diagnosis: Difficulty in walking, not elsewhere classified (R26.2);Pain;Other abnormalities of gait and mobility (R26.89) Pain - Right/Left: Right Pain - part of body: Hip     Time: 8295-6213 PT Time Calculation (min) (ACUTE ONLY): 47 min  Charges:    $Gait Training: 8-22 mins $Therapeutic Exercise: 8-22 mins $Therapeutic Activity: 8-22 mins PT General Charges $$ ACUTE PT VISIT: 1 Visit                    Daymon Evans PT 05/23/2023  Acute Rehabilitation Services  Office [phone]

## 2023-05-23 NOTE — Evaluation (Signed)
 Physical Therapy Evaluation Patient Details Name: Penny Lamb MRN: [id number] DOB: [birth year] Today's Date: 05/23/2023  History of Present Illness  78 y.o. female admitted 05/22/23 for R THA-AA. PMH: OA, asthma, COPD, depression.  Clinical Impression  Pt is s/p THA resulting in the deficits listed below (see PT Problem List). Pt pleasant and puts forth good effort. Mod assist for supine to sit, min assist sit to stand. Pt ambulated 76' with RW then became dizzy so was assisted to recliner where BP was 57/35, HR 30s.  Pt became unresponsive, RN called, rapid response called. Pt c/o sharp chest pain. Assisted with +4 transfer from recliner to bed. Pt will benefit from acute skilled PT to increase their independence and safety with mobility to facilitate discharge.          If plan is discharge home, recommend the following: A little help with bathing/dressing/bathroom;Assistance with cooking/housework;Assist for transportation;Help with stairs or ramp for entrance   Can travel by private vehicle        Equipment Recommendations Rolling walker (2 wheels)  Recommendations for Other Services       Functional Status Assessment Patient has had a recent decline in their functional status and demonstrates the ability to make significant improvements in function in a reasonable and predictable amount of time.     Precautions / Restrictions Precautions Precautions: Fall Recall of Precautions/Restrictions: Intact Precaution/Restrictions Comments: denies falls in past 6 months Restrictions Weight Bearing Restrictions Per Provider Order: No      Mobility  Bed Mobility Overal bed mobility: Needs Assistance Bed Mobility: Supine to Sit     Supine to sit: Mod assist     General bed mobility comments: assist to raise trunk, pivot hips, and advance RLE    Transfers Overall transfer level: Needs assistance Equipment used: Rolling walker (2 wheels) Transfers: Sit to/from  Stand Sit to Stand: Min assist           General transfer comment: VCs hand placement, min A to power up    Ambulation/Gait Ambulation/Gait assistance: Contact guard assist Gait Distance (Feet): 50 Feet Assistive device: Rolling walker (2 wheels) Gait Pattern/deviations: Step-to pattern, Decreased step length - right, Decreased step length - left Gait velocity: decr     General Gait Details: distance limited by dizziness, pt assisted to recliner where BP was 57/35, HR 30s, pt pale/clammy and minimally responsive, RN called to room, rapid response called. Assisted with transfer from recliner to bed.  Stairs            Wheelchair Mobility     Tilt Bed    Modified Rankin (Stroke Patients Only)       Balance Overall balance assessment: Modified Independent                                           Pertinent Vitals/Pain Pain Assessment Pain Assessment: 0-10 Pain Score: 8  Pain Location: R hip with walking Pain Descriptors / Indicators: Sore Pain Intervention(s): Limited activity within patient's tolerance, Monitored during session, Premedicated before session, Ice applied    Home Living Family/patient expects to be discharged to:: (P) Private residence Living Arrangements: (P) Alone Available Help at Discharge: (P) Family;Available 24 hours/day Type of Home: (P) House Home Access: (P) Stairs to enter Entrance Stairs-Rails: (P) None Entrance Stairs-Number of Steps: (P) 1   Home Layout: (P) One level Home Equipment: (P) Cane -  single point Additional Comments: (P) daughter to assist at home    Prior Function Prior Level of Function : (P) Independent/Modified Independent             Mobility Comments: (P) walked with [facility]       Extremity/Trunk Assessment   Upper Extremity Assessment Upper Extremity Assessment: Overall WFL for tasks assessed    Lower Extremity Assessment Lower Extremity Assessment: RLE deficits/detail RLE  Deficits / Details: knee ext -3/5 RLE: Unable to fully assess due to pain RLE Sensation: WNL RLE Coordination: WNL    Cervical / Trunk Assessment Cervical / Trunk Assessment: Normal  Communication   Communication Communication: No apparent difficulties    Cognition Arousal: Alert Behavior During Therapy: WFL for tasks assessed/performed   PT - Cognitive impairments: No apparent impairments                         Following commands: Intact       Cueing       General Comments      Exercises     Assessment/Plan    PT Assessment Patient needs continued PT services  PT Problem List Decreased activity tolerance;Decreased strength;Decreased range of motion;Decreased mobility;Decreased balance;Pain       PT Treatment Interventions Gait training;Therapeutic exercise;Therapeutic activities;DME instruction;Functional mobility training;Stair training;Patient/family education    PT Goals (Current goals can be found in the Care Plan section)  Acute Rehab PT Goals Patient Stated Goal: flower gardening PT Goal Formulation: With patient Time For Goal Achievement: 06/06/23 Potential to Achieve Goals: Good    Frequency 7X/week     Co-evaluation               AM-PAC PT "6 Clicks" Mobility  Outcome Measure Help needed turning from your back to your side while in a flat bed without using bedrails?: A Little Help needed moving from lying on your back to sitting on the side of a flat bed without using bedrails?: A Lot Help needed moving to and from a bed to a chair (including a wheelchair)?: A Little Help needed standing up from a chair using your arms (e.g., wheelchair or bedside chair)?: A Little Help needed to walk in hospital room?: A Little Help needed climbing 3-5 steps with a railing? : A Lot 6 Click Score: 16    End of Session Equipment Utilized During Treatment: Gait belt Activity Tolerance: Treatment limited secondary to medical complications  (Comment) Patient left: in bed;with bed alarm set;with call bell/phone within reach;with nursing/sitter in room Nurse Communication: Mobility status;Other (comment) (hypotension, bradycardia, unresponsive) PT Visit Diagnosis: Difficulty in walking, not elsewhere classified (R26.2);Pain;Other abnormalities of gait and mobility (R26.89) Pain - Right/Left: Right Pain - part of body: Hip    Time: 0920-0952 PT Time Calculation (min) (ACUTE ONLY): 32 min   Charges:   PT Evaluation $PT Eval Moderate Complexity: 1 Mod PT Treatments $Gait Training: 8-22 mins PT General Charges $$ ACUTE PT VISIT: 1 Visit         Daymon Evans PT 05/23/2023  Acute Rehabilitation Services  Office [phone]

## 2023-05-23 NOTE — Plan of Care (Signed)

## 2023-05-23 NOTE — TOC Transition Note (Addendum)
 Transition of Care [facility]) - Discharge Note   Patient Details  Name: Penny Lamb MRN: [id number] Date of Birth: 1945/08/19  Transition of Care [facility] CM/SW Contact:  Bari Leys, RN Phone Number: 05/23/2023, 11:03 AM   Clinical Narrative:  Meet with patient at bedside to review dc therapy and home equipment needs, pt confirmed HEP,  [employer] to deliver RW to bedside. No TOC needs.      Final next level of care: Home/Self Care Barriers to Discharge: No Barriers Identified   Patient Goals and CMS Choice Patient states their goals for this hospitalization and ongoing recovery are:: return home          Discharge Placement                       Discharge Plan and Services Additional resources added to the After Visit Summary for                                       Social Drivers of Health (SDOH) Interventions SDOH Screenings   Food Insecurity: No Food Insecurity (05/22/2023)  Housing: Low Risk  (05/22/2023)  Transportation Needs: No Transportation Needs (05/22/2023)  Utilities: Not At Risk (05/22/2023)  Financial Resource Strain: Low Risk  (08/02/2022)   Received from [facility]  Physical Activity: Unknown (08/02/2022)   Received from [facility]  Social Connections: Socially Isolated (05/22/2023)  Stress: No Stress Concern Present (08/02/2022)   Received from [facility]  Tobacco Use: Medium Risk (05/22/2023)     Readmission Risk Interventions     No data to display

## 2023-05-23 NOTE — Care Management Obs Status (Signed)
 MEDICARE OBSERVATION STATUS NOTIFICATION   Patient Details  Name: Penny Lamb MRN: [id number] Date of Birth: [birth year]   Medicare Observation Status Notification Given:  Yes    Bari Leys, RN 05/23/2023, 8:59 AM

## 2023-05-23 NOTE — Progress Notes (Signed)
 Discharge meds delivered to patient at bedside [facility]

## 2023-05-23 NOTE — Plan of Care (Signed)
  Problem: Education: Goal: Knowledge of General Education information will improve Description: Including pain rating scale, medication(s)/side effects and non-pharmacologic comfort measures Outcome: Adequate for Discharge   Problem: Health Behavior/Discharge Planning: Goal: Ability to manage health-related needs will improve Outcome: Adequate for Discharge   Problem: Clinical Measurements: Goal: Ability to maintain clinical measurements within normal limits will improve Outcome: Adequate for Discharge Goal: Will remain free from infection Outcome: Adequate for Discharge Goal: Diagnostic test results will improve Outcome: Adequate for Discharge Goal: Respiratory complications will improve Outcome: Adequate for Discharge Goal: Cardiovascular complication will be avoided Outcome: Adequate for Discharge   Problem: Activity: Goal: Risk for activity intolerance will decrease Outcome: Adequate for Discharge   Problem: Nutrition: Goal: Adequate nutrition will be maintained Outcome: Adequate for Discharge   Problem: Coping: Goal: Level of anxiety will decrease Outcome: Adequate for Discharge   Problem: Elimination: Goal: Will not experience complications related to bowel motility Outcome: Adequate for Discharge Goal: Will not experience complications related to urinary retention Outcome: Adequate for Discharge   Problem: Pain Managment: Goal: General experience of comfort will improve and/or be controlled Outcome: Adequate for Discharge   Problem: Safety: Goal: Ability to remain free from injury will improve Outcome: Adequate for Discharge   Problem: Skin Integrity: Goal: Risk for impaired skin integrity will decrease Outcome: Adequate for Discharge   Problem: Education: Goal: Knowledge of the prescribed therapeutic regimen will improve Outcome: Adequate for Discharge Goal: Understanding of discharge needs will improve Outcome: Adequate for Discharge Goal:  Individualized Educational Video(s) Outcome: Adequate for Discharge   Problem: Activity: Goal: Ability to avoid complications of mobility impairment will improve Outcome: Adequate for Discharge Goal: Ability to tolerate increased activity will improve Outcome: Adequate for Discharge   Problem: Clinical Measurements: Goal: Postoperative complications will be avoided or minimized Outcome: Adequate for Discharge   Problem: Pain Management: Goal: Pain level will decrease with appropriate interventions Outcome: Adequate for Discharge   Problem: Skin Integrity: Goal: Will show signs of wound healing Outcome: Adequate for Discharge

## 2023-05-23 NOTE — Progress Notes (Signed)
 Physical Therapy Treatment Patient Details Name: Penny Lamb MRN: [id number] DOB: 1945-06-02 Today's Date: 05/23/2023   History of Present Illness 78 y.o. female admitted 05/22/23 for R THA-AA. PMH: OA, asthma, COPD, depression.    PT Comments  Stair training completed. PT goals met.     If plan is discharge home, recommend the following: A little help with bathing/dressing/bathroom;Assistance with cooking/housework;Assist for transportation;Help with stairs or ramp for entrance   Can travel by private vehicle        Equipment Recommendations  Rolling walker (2 wheels)    Recommendations for Other Services       Precautions / Restrictions Precautions Precautions: Fall Recall of Precautions/Restrictions: Intact Precaution/Restrictions Comments: denies falls in past 6 months Restrictions Weight Bearing Restrictions Per Provider Order: No     Mobility  Bed Mobility Overal bed mobility: Modified Independent Bed Mobility: Supine to Sit     Supine to sit: HOB elevated, Used rails, Modified independent (Device/Increase time)     General bed mobility comments: up in recliner    Transfers Overall transfer level: Modified independent Equipment used: Rolling walker (2 wheels) Transfers: Sit to/from Stand Sit to Stand: Modified independent (Device/Increase time)           General transfer comment: VCs hand placement    Ambulation/Gait Ambulation/Gait assistance: Modified independent (Device/Increase time) Gait Distance (Feet): 45 Feet Assistive device: Rolling walker (2 wheels) Gait Pattern/deviations: Step-to pattern, Decreased step length - right, Decreased step length - left Gait velocity: decr     General Gait Details: steady, no loss of balance, no dizziness, HR 90s with activity   Stairs Stairs: Yes Stairs assistance: Contact guard assist Stair Management: No rails, Backwards, Forwards, Step to pattern, With walker Number of Stairs: 1 General  stair comments: pt performed both backwards and forwards technique with RW and no rails, VCs sequencing   Wheelchair Mobility     Tilt Bed    Modified Rankin (Stroke Patients Only)       Balance Overall balance assessment: Modified Independent                                          Communication Communication Communication: No apparent difficulties  Cognition Arousal: Alert Behavior During Therapy: WFL for tasks assessed/performed   PT - Cognitive impairments: No apparent impairments                         Following commands: Intact      Cueing    Exercises     General Comments        Pertinent Vitals/Pain Pain Assessment Pain Assessment: 0-10 Pain Score: 6  Pain Location: R hip with walking Pain Descriptors / Indicators: Sore Pain Intervention(s): Limited activity within patient's tolerance, Monitored during session, Repositioned, Ice applied    Home Living                          Prior Function            PT Goals (current goals can now be found in the care plan section) Acute Rehab PT Goals Patient Stated Goal: flower gardening PT Goal Formulation: With patient Time For Goal Achievement: 06/06/23 Potential to Achieve Goals: Good Progress towards PT goals: Goals met/education completed, patient discharged from PT    Frequency    7X/week  PT Plan      Co-evaluation              AM-PAC PT "6 Clicks" Mobility   Outcome Measure  Help needed turning from your back to your side while in a flat bed without using bedrails?: A Little Help needed moving from lying on your back to sitting on the side of a flat bed without using bedrails?: A Little Help needed moving to and from a bed to a chair (including a wheelchair)?: None Help needed standing up from a chair using your arms (e.g., wheelchair or bedside chair)?: None Help needed to walk in hospital room?: None Help needed climbing 3-5 steps  with a railing? : A Little 6 Click Score: 21    End of Session Equipment Utilized During Treatment: Gait belt Activity Tolerance: Patient tolerated treatment well Patient left: with call bell/phone within reach;in chair Nurse Communication: Mobility status PT Visit Diagnosis: Difficulty in walking, not elsewhere classified (R26.2);Pain;Other abnormalities of gait and mobility (R26.89) Pain - Right/Left: Right Pain - part of body: Hip     Time: 9604-5409 PT Time Calculation (min) (ACUTE ONLY): 22 min  Charges:    $Gait Training: 8-22 mins  PT General Charges $$ ACUTE PT VISIT: 1 Visit                     Daymon Evans PT 05/23/2023  Acute Rehabilitation Services  Office [phone]
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
## 2023-05-31 ENCOUNTER — Encounter (HOSPITAL_COMMUNITY): Payer: Self-pay | Admitting: Orthopedic Surgery

## 2023-05-31 NOTE — Anesthesia Postprocedure Evaluation (Signed)
 Anesthesia Post Note  Patient: Penny Lamb  Procedure(s) Performed: ARTHROPLASTY, HIP, TOTAL, ANTERIOR APPROACH (Right: Hip)     Patient location during evaluation: PACU Anesthesia Type: MAC and Spinal Level of consciousness: awake and alert Pain management: pain level controlled Vital Signs Assessment: post-procedure vital signs reviewed and stable Respiratory status: spontaneous breathing, nonlabored ventilation and respiratory function stable Cardiovascular status: stable and blood pressure returned to baseline Postop Assessment: no apparent nausea or vomiting Anesthetic complications: no   No notable events documented.               Bienvenido Proehl

## 2023-06-04 DIAGNOSIS — Z96641 Presence of right artificial hip joint: Secondary | ICD-10-CM | POA: Diagnosis not present

## 2023-06-04 DIAGNOSIS — G4733 Obstructive sleep apnea (adult) (pediatric): Secondary | ICD-10-CM | POA: Diagnosis not present

## 2023-06-18 ENCOUNTER — Other Ambulatory Visit (HOSPITAL_COMMUNITY): Payer: Self-pay

## 2023-06-19 ENCOUNTER — Other Ambulatory Visit (HOSPITAL_COMMUNITY): Payer: Self-pay

## 2023-06-20 DIAGNOSIS — G4733 Obstructive sleep apnea (adult) (pediatric): Secondary | ICD-10-CM | POA: Diagnosis not present

## 2023-06-20 DIAGNOSIS — G4719 Other hypersomnia: Secondary | ICD-10-CM | POA: Diagnosis not present

## 2023-07-03 DIAGNOSIS — Z5189 Encounter for other specified aftercare: Secondary | ICD-10-CM | POA: Diagnosis not present

## 2023-07-23 DIAGNOSIS — R208 Other disturbances of skin sensation: Secondary | ICD-10-CM | POA: Diagnosis not present

## 2023-07-23 DIAGNOSIS — B0089 Other herpesviral infection: Secondary | ICD-10-CM | POA: Diagnosis not present

## 2023-07-23 DIAGNOSIS — L821 Other seborrheic keratosis: Secondary | ICD-10-CM | POA: Diagnosis not present

## 2023-07-25 DIAGNOSIS — G4733 Obstructive sleep apnea (adult) (pediatric): Secondary | ICD-10-CM | POA: Diagnosis not present

## 2023-07-30 DIAGNOSIS — T63461A Toxic effect of venom of wasps, accidental (unintentional), initial encounter: Secondary | ICD-10-CM | POA: Diagnosis not present

## 2023-07-30 DIAGNOSIS — Z6828 Body mass index (BMI) 28.0-28.9, adult: Secondary | ICD-10-CM | POA: Diagnosis not present

## 2023-08-05 DIAGNOSIS — G4733 Obstructive sleep apnea (adult) (pediatric): Secondary | ICD-10-CM | POA: Diagnosis not present

## 2023-09-03 DIAGNOSIS — G4733 Obstructive sleep apnea (adult) (pediatric): Secondary | ICD-10-CM | POA: Diagnosis not present

## 2023-09-15 ENCOUNTER — Telehealth: Admitting: Family

## 2023-09-15 DIAGNOSIS — R21 Rash and other nonspecific skin eruption: Secondary | ICD-10-CM | POA: Diagnosis not present

## 2023-09-15 MED ORDER — PERMETHRIN 5 % EX CREA: 1.0000 | TOPICAL_CREAM | Freq: Once | CUTANEOUS | 1 refills | Status: AC

## 2023-09-15 MED ORDER — TRIAMCINOLONE ACETONIDE 0.1 % EX CREA: 1.0000 | TOPICAL_CREAM | Freq: Two times a day (BID) | CUTANEOUS | 0 refills | Status: AC

## 2023-09-15 NOTE — Patient Instructions (Signed)
Scabies, Adult  Scabies is a skin condition that happens when very small insects called mites get under your skin. This causes severe itchiness and a rash that looks like pimples. Scabies is contagious. This means it can spread easily from person to person. If you get scabies, the people you live with may get it too. With the right treatment, symptoms often go away in 2-4 weeks. In most cases, scabies does not cause lasting problems. What are the causes? Scabies is caused by tiny mites (Sarcoptes scabiei) that can only be seen with a microscope. The mites get into the top layer of your skin and lay eggs. This is called an infestation. You may get scabies if: You have close contact with someone who has scabies. You come in contact with items that have the mites on them. These may include towels, bedding, or clothes. What increases the risk? You may be more likely to get scabies if: You live in a nursing home or extended care facility. You spend time in a place where a lot of people live close together, such as a shelter or prison. You have sex with a partner who has scabies. You care for others who are at risk for scabies. What are the signs or symptoms? Symptoms of scabies include: A rash that looks like pimples. It may include tiny red bumps or blisters. It is often found in the skinfolds or on the hands, wrists, elbows, armpits, chest, waist, groin, or buttocks. Severe itchiness. This is often worse at night. Skin irritation. This can include scaly patches or sores. The bumps from scabies may form a line (burrow) on the skin. The line may look thin, crooked, and grayish-white or skin colored. How is this diagnosed? Scabies may be diagnosed based on a physical exam of your skin. You may also have a skin test done. A sample of your skin may be taken (skin scraping) and looked at under a microscope for signs of mites. How is this treated? Scabies may be treated with: Medicated creams or  lotions to kill the mites. The cream or lotion is spread on your whole body and left for a few hours. In most cases, one treatment is enough to kill all the mites. In severe cases, the treatment may need to be done more than once. Medicated cream to help with the itching. Medicines taken by mouth (orally). These may help: Relieve itching. Reduce the swelling and redness. Kill the mites. This treatment may be used in severe cases. Follow these instructions at home: Medicines Take or apply over-the-counter and prescription medicines only as told by your health care provider. Apply medicated cream or lotion as told by your provider. Do not wash off the medicated cream or lotion until enough time has passed or as told by your provider. Skin care Try not to scratch or pick at the affected areas of your skin. Keep your fingernails closely trimmed. This can help reduce injury from scratching. Take cool baths or apply cool, wet cloths to your skin. This can help reduce itching. General instructions Clean all items that you touched in the 3 days before you were diagnosed. This includes bedding, clothes, towels, and furniture. Do this on the same day that you start treatment. Dry-clean items or use hot water to wash them. Dry them on the hot dry cycle. Place items that cannot be washed into closed, airtight plastic bags for at least 3 days. The mites cannot live for more than 3 days away from  human skin. Vacuum your furniture and mattresses. Make sure that other people who may have been infested see a provider. Where to find more information Centers for Disease Control and Prevention (CDC): [employer].no Contact a health care provider if: You have itching that does not go away after 4 weeks of treatment. You keep getting new bumps or burrows. You have redness, swelling, or pain near your rash after treatment. You have fluid, blood, or pus coming from your rash. You get thick crusts or scaly patches over  large areas of your skin. You have a fever. This information is not intended to replace advice given to you by your health care provider. Make sure you discuss any questions you have with your health care provider. Document Revised: 10/16/2021 Document Reviewed: 10/16/2021 Elsevier Patient Education  2024 [employer].

## 2023-09-15 NOTE — Progress Notes (Signed)
 Virtual Visit Consent   Penny Lamb, you are scheduled for a virtual visit with a [facility] provider today. Just as with appointments in the office, your consent must be obtained to participate. Your consent will be active for this visit and any virtual visit you may have with one of our providers in the next 365 days. If you have a MyChart account, a copy of this consent can be sent to you electronically.  As this is a virtual visit, video technology does not allow for your provider to perform a traditional examination. This may limit your provider's ability to fully assess your condition. If your provider identifies any concerns that need to be evaluated in person or the need to arrange testing (such as labs, EKG, etc.), we will make arrangements to do so. Although advances in technology are sophisticated, we cannot ensure that it will always work on either your end or our end. If the connection with a video visit is poor, the visit may have to be switched to a telephone visit. With either a video or telephone visit, we are not always able to ensure that we have a secure connection.  By engaging in this virtual visit, you consent to the provision of healthcare and authorize for your insurance to be billed (if applicable) for the services provided during this visit. Depending on your insurance coverage, you may receive a charge related to this service.  I need to obtain your verbal consent now. Are you willing to proceed with your visit today? Penny Lamb has provided verbal consent on 09/15/2023 for a virtual visit (video or telephone). Bari Learn, FNP  Date: 09/15/2023 10:51 AM   Virtual Visit via Video Note   I, Bari Learn, connected with  Penny Lamb  ([id number], July 10, 1945) on 09/15/23 at  9:00 AM EDT by a video-enabled telemedicine application and verified that I am speaking with the correct person using two identifiers.  Location: Patient: Virtual Visit  Location Patient: Home Provider: Virtual Visit Location Provider: Home Office   I discussed the limitations of evaluation and management by telemedicine and the availability of in person appointments. The patient expressed understanding and agreed to proceed.    History of Present Illness: Penny Lamb is a 78 y.o. who identifies as a female who was assigned female at birth, and is being seen today for rash on face, arms and hands. She put new furniture together from [place] and is worried that she has bedbugs. She has intense itching at night.   HPI: Rash This is a new problem. The current episode started 1 to 4 weeks ago. The problem has been gradually worsening since onset. The affected locations include the face, left arm, right arm, right hand and left hand. The rash is characterized by itchiness and redness.    Problems:  Patient Active Problem List   Diagnosis Date Noted   Primary osteoarthritis of right hip 05/22/2023   COPD GOLD II 03/11/2017   Drug allergy 10/20/2014   Genital herpes 12/31/2012   Rib pain on right side 01/09/2012   VAGINITIS, ATROPHIC 02/28/2010   Cystitis 02/21/2010   HEMATURIA UNSPECIFIED 02/21/2010   FOOT PAIN, LEFT 10/21/2009   DEPRESSION 09/14/2009   Allergic rhinitis 09/14/2009   Asthma 09/14/2009   ARTIFICIAL MENOPAUSE 09/14/2009   OA (osteoarthritis) of hip 09/14/2009   Headache 09/14/2009   SKIN CANCER, HX OF 09/14/2009   NEPHROLITHIASIS, HX OF 09/14/2009    Allergies:  Allergies  Allergen Reactions   Amoxicillin Nausea And  Vomiting   Bactrim  Diarrhea and Nausea And Vomiting    Headache severe   Belladonna Alkaloids Nausea And Vomiting   Codeine Nausea And Vomiting   Corticosteroids    Hydrocodone -Acetaminophen  Nausea And Vomiting    migraine   Ibuprofen Nausea And Vomiting   Macrobid  [Nitrofurantoin ] Diarrhea   Macrolides And Ketolides Nausea And Vomiting   Pneumococcal 20-Val Conj Vacc     URI   Prednisone     psychosis    Sulfa  Antibiotics Diarrhea   Tramadol      Seizures    Medications:  Current Outpatient Medications:    permethrin  (ELIMITE ) 5 % cream, Apply 1 Application topically once for 1 dose., Disp: 60 g, Rfl: 1   triamcinolone  cream (KENALOG ) 0.1 %, Apply 1 Application topically 2 (two) times daily., Disp: 30 g, Rfl: 0   acetaminophen  (TYLENOL ) 500 MG tablet, Take 500-1,000 mg by mouth every 6 (six) hours as needed (pain.)., Disp: , Rfl:    albuterol  (VENTOLIN  HFA) 108 (90 Base) MCG/ACT inhaler, Inhale 1-2 puffs into the lungs every 6 (six) hours as needed for wheezing or shortness of breath., Disp: , Rfl:    cyclobenzaprine  (FLEXERIL ) 10 MG tablet, Take 1 tablet (10 mg total) by mouth 3 (three) times daily as needed for muscle spasms., Disp: 40 tablet, Rfl: 0   dicyclomine  (BENTYL ) 10 MG capsule, Take 10-20 mg by mouth 2 (two) times daily as needed (abdominal spasms)., Disp: , Rfl:    gabapentin  (NEURONTIN ) 100 MG capsule, Take 1 capsule three times a day for one week following surgery.Then take 1 capsule two times a day for one week. Then take 1 capsule once a day for one week. Then discontinue., Disp: 42 capsule, Rfl: 0   HYDROmorphone  (DILAUDID ) 2 MG tablet, Take 0.5-1 tablets (1-2 mg total) by mouth every 4 (four) hours as needed for severe pain (pain score 7-10)., Disp: 30 tablet, Rfl: 0   Multiple Vitamin (MULTIVITAMIN WITH MINERALS) TABS tablet, Take 1 tablet by mouth in the morning and at bedtime. 10X [employer] Optimize Multivitamin, Disp: , Rfl:    Omega-3 Fatty Acids (FISH OIL PO), Take 1 capsule by mouth in the morning and at bedtime., Disp: , Rfl:    ondansetron  (ZOFRAN ) 4 MG tablet, Take 1 tablet (4 mg total) by mouth every 6 (six) hours as needed for nausea., Disp: 20 tablet, Rfl: 0   OVER THE COUNTER MEDICATION, Take 1 capsule by mouth in the morning. Water  Weight Away by Youth and Tonic (Dandelion/Green Tea/Cranberry), Disp: , Rfl:    Polyethyl Glycol-Propyl Glycol (SYSTANE OP), Place 1 drop  into both eyes 2 (two) times daily., Disp: , Rfl:   Observations/Objective: Patient is well-developed, well-nourished in no acute distress.  Resting comfortably  at home.  Head is normocephalic, atraumatic.  No labored breathing.  Speech is clear and coherent with logical content.  Patient is alert and oriented at baseline.  Papule rash on left cheek and bial  Assessment and Plan: 1. Rash and nonspecific skin eruption (Primary) - permethrin  (ELIMITE ) 5 % cream; Apply 1 Application topically once for 1 dose.  Dispense: 60 g; Refill: 1 - triamcinolone  cream (KENALOG ) 0.1 %; Apply 1 Application topically 2 (two) times daily.  Dispense: 30 g; Refill: 0  Unsure if this is bedbugs or scabies? PT strongly states these are some type of bites and worsen at night time. She has checked her mattress and bedding and did not see any bedbugs. Her face does not look like scabies. She  states there is no way possible that this could be any contact dermatis as she has not been exposed to anything new. I will give permethrin  cream to day to treat for possible scabies.  Wash all bedding and clothes is hot water .  Place all non washable items in a trash bag for three days Discussed how to look for bedbugs. Did discuss that there is no medical treatment for bedbugs other than to remove them from the home.  Approx 30 mins spent with patient, education, and chart review If symptoms do not improve, need to follow up in person.    Follow Up Instructions: I discussed the assessment and treatment plan with the patient. The patient was provided an opportunity to ask questions and all were answered. The patient agreed with the plan and demonstrated an understanding of the instructions.  A copy of instructions were sent to the patient via MyChart unless otherwise noted below.    The patient was advised to call back or seek an in-person evaluation if the symptoms worsen or if the condition fails to improve as  anticipated.    Bari Learn, FNP

## 2023-09-16 DIAGNOSIS — L259 Unspecified contact dermatitis, unspecified cause: Secondary | ICD-10-CM | POA: Diagnosis not present

## 2023-09-19 DIAGNOSIS — L249 Irritant contact dermatitis, unspecified cause: Secondary | ICD-10-CM | POA: Diagnosis not present

## 2023-09-24 ENCOUNTER — Other Ambulatory Visit: Payer: Self-pay | Admitting: Family

## 2023-09-24 DIAGNOSIS — R21 Rash and other nonspecific skin eruption: Secondary | ICD-10-CM

## 2023-10-03 DIAGNOSIS — G4733 Obstructive sleep apnea (adult) (pediatric): Secondary | ICD-10-CM | POA: Diagnosis not present

## 2023-11-19 DIAGNOSIS — L570 Actinic keratosis: Secondary | ICD-10-CM | POA: Diagnosis not present

## 2023-11-19 DIAGNOSIS — J439 Emphysema, unspecified: Secondary | ICD-10-CM | POA: Diagnosis not present

## 2023-11-21 DIAGNOSIS — G4733 Obstructive sleep apnea (adult) (pediatric): Secondary | ICD-10-CM | POA: Diagnosis not present

## 2023-12-03 DIAGNOSIS — H109 Unspecified conjunctivitis: Secondary | ICD-10-CM | POA: Diagnosis not present

## 2023-12-26 DIAGNOSIS — G4733 Obstructive sleep apnea (adult) (pediatric): Secondary | ICD-10-CM | POA: Diagnosis not present
